# Patient Record
Sex: Female | Born: 1990 | Race: Black or African American | Hispanic: No | Marital: Single | Smoking: Current every day smoker
Health system: Southern US, Community
[De-identification: ages and names within clinical notes are randomized; demographics above are authoritative.]

## PROBLEM LIST (undated history)

## (undated) ENCOUNTER — Inpatient Hospital Stay (HOSPITAL_COMMUNITY): Payer: Self-pay

## (undated) DIAGNOSIS — M419 Scoliosis, unspecified: Secondary | ICD-10-CM

## (undated) DIAGNOSIS — R51 Headache: Secondary | ICD-10-CM

## (undated) DIAGNOSIS — K219 Gastro-esophageal reflux disease without esophagitis: Secondary | ICD-10-CM

## (undated) DIAGNOSIS — M549 Dorsalgia, unspecified: Secondary | ICD-10-CM

## (undated) DIAGNOSIS — D649 Anemia, unspecified: Secondary | ICD-10-CM

## (undated) DIAGNOSIS — IMO0001 Reserved for inherently not codable concepts without codable children: Secondary | ICD-10-CM

## (undated) DIAGNOSIS — A749 Chlamydial infection, unspecified: Secondary | ICD-10-CM

## (undated) DIAGNOSIS — N390 Urinary tract infection, site not specified: Secondary | ICD-10-CM

## (undated) DIAGNOSIS — G8929 Other chronic pain: Secondary | ICD-10-CM

## (undated) HISTORY — PX: BACK SURGERY: SHX140

---
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
## 2009-01-02 ENCOUNTER — Emergency Department (HOSPITAL_COMMUNITY): Admission: EM | Admit: 2009-01-02 | Discharge: 2009-01-02 | Payer: Self-pay | Admitting: Emergency Medicine

## 2009-01-19 ENCOUNTER — Emergency Department (HOSPITAL_COMMUNITY): Admission: EM | Admit: 2009-01-19 | Discharge: 2009-01-19 | Payer: Self-pay | Admitting: Emergency Medicine

## 2009-04-23 ENCOUNTER — Emergency Department (HOSPITAL_COMMUNITY): Admission: EM | Admit: 2009-04-23 | Discharge: 2009-04-23 | Payer: Self-pay | Admitting: Emergency Medicine

## 2009-05-30 ENCOUNTER — Emergency Department (HOSPITAL_COMMUNITY): Admission: EM | Admit: 2009-05-30 | Discharge: 2009-05-30 | Payer: Self-pay | Admitting: Emergency Medicine

## 2009-06-20 ENCOUNTER — Emergency Department (HOSPITAL_COMMUNITY): Admission: EM | Admit: 2009-06-20 | Discharge: 2009-06-20 | Payer: Self-pay | Admitting: Emergency Medicine

## 2009-11-23 ENCOUNTER — Emergency Department (HOSPITAL_COMMUNITY)
Admission: EM | Admit: 2009-11-23 | Discharge: 2009-11-23 | Payer: Self-pay | Source: Home / Self Care | Admitting: Emergency Medicine

## 2010-01-25 ENCOUNTER — Inpatient Hospital Stay (HOSPITAL_COMMUNITY): Admission: AD | Admit: 2010-01-25 | Discharge: 2010-01-25 | Payer: Self-pay | Admitting: Obstetrics & Gynecology

## 2010-01-25 ENCOUNTER — Ambulatory Visit: Payer: Self-pay | Admitting: Obstetrics & Gynecology

## 2010-04-23 ENCOUNTER — Inpatient Hospital Stay (HOSPITAL_COMMUNITY)
Admission: AD | Admit: 2010-04-23 | Discharge: 2010-04-23 | Payer: Self-pay | Source: Home / Self Care | Attending: Obstetrics | Admitting: Obstetrics

## 2010-07-03 LAB — URINALYSIS, ROUTINE W REFLEX MICROSCOPIC
Bilirubin Urine: NEGATIVE
Glucose, UA: NEGATIVE mg/dL
Hgb urine dipstick: NEGATIVE
Ketones, ur: NEGATIVE mg/dL
Nitrite: NEGATIVE
Protein, ur: NEGATIVE mg/dL
Specific Gravity, Urine: >1.03 — ABNORMAL HIGH (ref 1.005–1.030)
Urobilinogen, UA: 0.2 mg/dL (ref 0.0–1.0)
pH: 6 (ref 5.0–8.0)

## 2010-07-03 LAB — CBC
HCT: 30.2 % — ABNORMAL LOW (ref 36.0–46.0)
Hemoglobin: 10.2 g/dL — ABNORMAL LOW (ref 12.0–15.0)
MCHC: 33.8 g/dL (ref 30.0–36.0)
MCV: 86.3 fL (ref 78.0–100.0)
Platelets: 160 10*3/uL (ref 150–400)

## 2010-07-07 LAB — URINALYSIS, ROUTINE W REFLEX MICROSCOPIC
Bilirubin Urine: NEGATIVE
Glucose, UA: NEGATIVE mg/dL
Hgb urine dipstick: NEGATIVE
Ketones, ur: NEGATIVE mg/dL
Nitrite: NEGATIVE
Protein, ur: NEGATIVE mg/dL
Specific Gravity, Urine: 1.026 (ref 1.005–1.030)
Urobilinogen, UA: 1 mg/dL (ref 0.0–1.0)
pH: 7.5 (ref 5.0–8.0)

## 2010-07-07 LAB — BASIC METABOLIC PANEL
BUN: 10 mg/dL (ref 6–23)
CO2: 24 mEq/L (ref 19–32)
Calcium: 9.6 mg/dL (ref 8.4–10.5)
Chloride: 106 mEq/L (ref 96–112)
Creatinine, Ser: 0.67 mg/dL (ref 0.4–1.2)
GFR calc Af Amer: >60 mL/min (ref >60)
GFR calc non Af Amer: >60 mL/min (ref >60)
Glucose, Bld: 83 mg/dL (ref 70–99)
Potassium: 3.8 mEq/L (ref 3.5–5.1)
Sodium: 137 mEq/L (ref 135–145)

## 2010-07-07 LAB — URINE MICROSCOPIC-ADD ON

## 2010-07-07 LAB — CBC
HCT: 39 % (ref 36.0–46.0)
Hemoglobin: 13.2 g/dL (ref 12.0–15.0)
MCH: 29.6 pg (ref 26.0–34.0)
MCHC: 33.8 g/dL (ref 30.0–36.0)
MCV: 87.4 fL (ref 78.0–100.0)
Platelets: 212 10*3/uL (ref 150–400)
RBC: 4.46 MIL/uL (ref 3.87–5.11)
RDW: 12.9 % (ref 11.5–15.5)
WBC: 6.8 10*3/uL (ref 4.0–10.5)

## 2010-07-07 LAB — DIFFERENTIAL
Basophils Absolute: 0 10*3/uL (ref 0.0–0.1)
Basophils Relative: 0 % (ref 0–1)
Eosinophils Absolute: 0.4 10*3/uL (ref 0.0–0.7)
Eosinophils Relative: 5 % (ref 0–5)
Lymphocytes Relative: 47 % — ABNORMAL HIGH (ref 12–46)
Lymphs Abs: 3.2 10*3/uL (ref 0.7–4.0)
Monocytes Absolute: 0.7 10*3/uL (ref 0.1–1.0)
Monocytes Relative: 10 % (ref 3–12)
Neutro Abs: 2.5 10*3/uL (ref 1.7–7.7)
Neutrophils Relative %: 37 % — ABNORMAL LOW (ref 43–77)

## 2010-07-07 LAB — HCG, QUANTITATIVE, PREGNANCY: hCG, Beta Chain, Quant, S: 6154 m[IU]/mL — ABNORMAL HIGH (ref <5)

## 2010-07-07 LAB — POCT PREGNANCY, URINE: Preg Test, Ur: POSITIVE

## 2010-07-09 LAB — POCT PREGNANCY, URINE: Preg Test, Ur: NEGATIVE

## 2010-07-12 LAB — URINALYSIS, ROUTINE W REFLEX MICROSCOPIC
Bilirubin Urine: NEGATIVE
Glucose, UA: NEGATIVE mg/dL
Glucose, UA: NEGATIVE mg/dL
Hgb urine dipstick: NEGATIVE
Hgb urine dipstick: NEGATIVE
Ketones, ur: NEGATIVE mg/dL
Nitrite: POSITIVE — AB
Protein, ur: NEGATIVE mg/dL
Protein, ur: NEGATIVE mg/dL
Specific Gravity, Urine: 1.024 (ref 1.005–1.030)
Urobilinogen, UA: 1 mg/dL (ref 0.0–1.0)
pH: 7 (ref 5.0–8.0)

## 2010-07-12 LAB — URINE MICROSCOPIC-ADD ON

## 2010-07-14 ENCOUNTER — Encounter (HOSPITAL_COMMUNITY)
Admission: RE | Admit: 2010-07-14 | Discharge: 2010-07-14 | Disposition: A | Discharge status: Home / Self Care | Payer: Medicaid Other | Source: Ambulatory Visit | Attending: Obstetrics | Admitting: Obstetrics

## 2010-07-14 LAB — CBC
HCT: 36 % (ref 36.0–46.0)
MCV: 87.6 fL (ref 78.0–100.0)
RBC: 4.11 MIL/uL (ref 3.87–5.11)
RDW: 13.9 % (ref 11.5–15.5)
WBC: 4.9 10*3/uL (ref 4.0–10.5)

## 2010-07-14 LAB — RPR: RPR Ser Ql: NONREACTIVE

## 2010-07-18 ENCOUNTER — Inpatient Hospital Stay (HOSPITAL_COMMUNITY)
Admission: AD | Admit: 2010-07-18 | Discharge: 2010-07-21 | DRG: 775 | Disposition: A | Discharge status: Home / Self Care | Payer: Medicaid Other | Source: Ambulatory Visit | Attending: Obstetrics | Admitting: Obstetrics

## 2010-07-18 DIAGNOSIS — O34219 Maternal care for unspecified type scar from previous cesarean delivery: Principal | ICD-10-CM | POA: Diagnosis present

## 2010-07-18 LAB — CBC
HCT: 34.3 % — ABNORMAL LOW (ref 36.0–46.0)
Hemoglobin: 11.4 g/dL — ABNORMAL LOW (ref 12.0–15.0)
RBC: 3.92 MIL/uL (ref 3.87–5.11)
WBC: 7.2 10*3/uL (ref 4.0–10.5)

## 2010-07-20 ENCOUNTER — Ambulatory Visit (HOSPITAL_COMMUNITY): Admission: RE | Admit: 2010-07-20 | Payer: Medicaid Other | Source: Ambulatory Visit | Admitting: Obstetrics

## 2010-07-20 LAB — CBC
HCT: 25.7 % — ABNORMAL LOW (ref 36.0–46.0)
Hemoglobin: 8.5 g/dL — ABNORMAL LOW (ref 12.0–15.0)
MCV: 87.1 fL (ref 78.0–100.0)
RBC: 2.95 MIL/uL — ABNORMAL LOW (ref 3.87–5.11)
WBC: 13.6 10*3/uL — ABNORMAL HIGH (ref 4.0–10.5)

## 2010-07-29 LAB — GC/CHLAMYDIA PROBE AMP, GENITAL

## 2010-07-29 LAB — URINE MICROSCOPIC-ADD ON

## 2010-07-29 LAB — URINE CULTURE

## 2010-07-29 LAB — POCT PREGNANCY, URINE: Preg Test, Ur: NEGATIVE

## 2010-07-29 LAB — URINALYSIS, ROUTINE W REFLEX MICROSCOPIC
Bilirubin Urine: NEGATIVE
Specific Gravity, Urine: 1.017 (ref 1.005–1.030)
pH: 6 (ref 5.0–8.0)

## 2010-07-29 LAB — WET PREP, GENITAL: Trich, Wet Prep: NONE SEEN

## 2010-08-07 LAB — URINALYSIS, ROUTINE W REFLEX MICROSCOPIC
Bilirubin Urine: NEGATIVE
Hgb urine dipstick: NEGATIVE
Ketones, ur: NEGATIVE mg/dL
Nitrite: NEGATIVE
pH: >9 — ABNORMAL HIGH (ref 5.0–8.0)

## 2010-08-07 LAB — WET PREP, GENITAL
Trich, Wet Prep: NONE SEEN
Yeast Wet Prep HPF POC: NONE SEEN

## 2010-08-07 LAB — CBC
Hemoglobin: 13.6 g/dL (ref 12.0–15.0)
MCHC: 33.2 g/dL (ref 30.0–36.0)
RBC: 4.65 MIL/uL (ref 3.87–5.11)
WBC: 7.4 10*3/uL (ref 4.0–10.5)

## 2010-08-07 LAB — GC/CHLAMYDIA PROBE AMP, GENITAL
Chlamydia, DNA Probe: POSITIVE — AB
GC Probe Amp, Genital: POSITIVE — AB

## 2010-08-07 LAB — URINE MICROSCOPIC-ADD ON
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
## 2010-09-08 NOTE — Op Note (Signed)
NAME:  SHAKEILA, PFARR                ACCOUNT NO.:  [id number]   MEDICAL RECORD NO.:  [id number]          PATIENT TYPE:  INP   LOCATION:  9106                          FACILITY:  [facility]   PHYSICIAN:  Naima A. Dillard, M.D. DATE OF BIRTH:  [birth year]   DATE OF PROCEDURE:  10/17/2005  DATE OF DISCHARGE:                                 OPERATIVE REPORT   PREOPERATIVE DIAGNOSIS:  nonReassuring fetal heart tones, pregnancy-induced  hypertension at term.   POSTOPERATIVE DIAGNOSIS:  nonReassuring fetal heart tones, pregnancy-induced  hypertension at term.   PROCEDURE:  Primary low transverse cesarean section.   ANESTHESIA:  Epidural.   SURGEON:  Naima A. Dillard, M.D.   ASSISTANT:  __________.   ESTIMATED BLOOD LOSS:  300 cc.   URINE OUTPUT:  650 cc clear urine.   IV FLUIDS:  1000 cc Crystalloid.   FINDINGS:  Female infant in vertex presentation, born at 33 with Apgars of 9  and 9.  Weight 6 pounds, 14 ounces.  Baby had nuchal cord x2.  Placenta was  sent to pathology.   PROCEDURE IN DETAIL:  Patient was taken to the operating room after consent  was obtained.  She understood the risks to be but not limited to bleeding,  infection, damage to internal organs, like bowel, bladder, major blood  vessels, and also problems with anesthesia.  The patient decided to proceed  with C-section.  She was taken to the operating room where her epidural  anesthesia was found to be adequate.  A Pfannenstiel incision was made with  the scalp and carried down to the fascia.  The fascia was incised in the  midline and extended bilaterally with Bovie cautery.  Kochers x2 were  placed, and the superior aspect of the fascia was dissected off the rectus  muscles both sharply and bluntly.  The inferior aspect of the fascia was  dissected off the rectus muscles in a similar fashion.  The rectus muscles  were separated in the midline.  The peritoneum was identified and entered  bluntly.  A bladder blade was  placed.  The vesicouterine peritoneum was  identified, tented up and entered sharply and extended bilaterally.  The  bladder blade was replaced.  A primary low transverse uterine incision was  made with a scalpel and extended horizontally and bilaterally with bandage  scissors.  The infant's head was delivered without difficulty.  Nuchal cord  x2 was easily reduced.  Mouth and naris were bulb-suctioned.  The infant was  delivered without difficulty.  The cord was clamped and cut.  The placenta  was delivered.  The uterus was cleared of all clot and debris.  The uterine  incision was repaired with 0 Vicryl in a running locked fashion.  A second  layer of 0 Vicryl was used to embrocate the uterus.  There were several  bleeding areas along the incision line, which were hemostatic with several  figure-of-eight stitches of 0 Vicryl.  Irrigation was done.  Hemostasis was  assured.  The patient was noted to have normal tubes and ovaries  bilaterally.  The peritoneum was  closed with 0 chromic.  The fascia was  closed with 0 Vicryl in a  running fashion.  The skin and subcutaneous tissue was irrigated and made  hemostatic with Bovie cautery.  The skin was closed with 3-0 Monocryl in a  subcuticular fashion.  Sponge, lap, and needle counts were correct.  The  patient went to the recovery room in stable condition.      Naima A. Normand Sloop, M.D.  Electronically Signed     NAD/MEDQ  D:  10/17/2005  T:  10/17/2005  Job:  [id number]
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
## 2010-11-14 ENCOUNTER — Emergency Department (HOSPITAL_COMMUNITY)
Admission: EM | Admit: 2010-11-14 | Discharge: 2010-11-14 | Disposition: A | Discharge status: Home / Self Care | Payer: Medicaid Other | Attending: Emergency Medicine | Admitting: Emergency Medicine

## 2010-11-14 DIAGNOSIS — R51 Headache: Secondary | ICD-10-CM | POA: Insufficient documentation

## 2010-11-14 DIAGNOSIS — M26609 Unspecified temporomandibular joint disorder, unspecified side: Secondary | ICD-10-CM | POA: Insufficient documentation

## 2010-11-18 ENCOUNTER — Emergency Department (HOSPITAL_COMMUNITY)
Admission: EM | Admit: 2010-11-18 | Discharge: 2010-11-18 | Disposition: A | Discharge status: Home / Self Care | Payer: Medicaid Other | Attending: Emergency Medicine | Admitting: Emergency Medicine

## 2010-11-18 ENCOUNTER — Emergency Department (HOSPITAL_COMMUNITY): Payer: Medicaid Other

## 2010-11-18 DIAGNOSIS — R259 Unspecified abnormal involuntary movements: Secondary | ICD-10-CM | POA: Insufficient documentation

## 2010-11-18 DIAGNOSIS — S02600A Fracture of unspecified part of body of mandible, initial encounter for closed fracture: Secondary | ICD-10-CM | POA: Insufficient documentation

## 2010-11-18 DIAGNOSIS — X58XXXA Exposure to other specified factors, initial encounter: Secondary | ICD-10-CM | POA: Insufficient documentation

## 2010-11-18 DIAGNOSIS — R6884 Jaw pain: Secondary | ICD-10-CM | POA: Insufficient documentation

## 2010-11-18 DIAGNOSIS — S02640A Fracture of ramus of mandible, unspecified side, initial encounter for closed fracture: Secondary | ICD-10-CM | POA: Insufficient documentation

## 2010-11-18 LAB — COMPREHENSIVE METABOLIC PANEL
AST: 17 U/L (ref 0–37)
Albumin: 4 g/dL (ref 3.5–5.2)
Alkaline Phosphatase: 53 U/L (ref 39–117)
BUN: 13 mg/dL (ref 6–23)
Chloride: 108 mEq/L (ref 96–112)
Potassium: 4.1 mEq/L (ref 3.5–5.1)
Total Bilirubin: 0.3 mg/dL (ref 0.3–1.2)
Total Protein: 7.2 g/dL (ref 6.0–8.3)

## 2010-11-18 LAB — CBC
MCHC: 33.2 g/dL (ref 30.0–36.0)
Platelets: 218 10*3/uL (ref 150–400)
RDW: 15 % (ref 11.5–15.5)
WBC: 7.2 10*3/uL (ref 4.0–10.5)

## 2010-11-18 LAB — DIFFERENTIAL
Basophils Absolute: 0 10*3/uL (ref 0.0–0.1)
Basophils Relative: 0 % (ref 0–1)
Eosinophils Absolute: 0.4 10*3/uL (ref 0.0–0.7)
Eosinophils Relative: 5 % (ref 0–5)
Monocytes Absolute: 0.4 10*3/uL (ref 0.1–1.0)

## 2010-12-06 ENCOUNTER — Emergency Department (HOSPITAL_COMMUNITY)
Admission: EM | Admit: 2010-12-06 | Discharge: 2010-12-06 | Disposition: A | Discharge status: Home / Self Care | Payer: Medicaid Other | Attending: Emergency Medicine | Admitting: Emergency Medicine

## 2010-12-06 ENCOUNTER — Emergency Department (HOSPITAL_COMMUNITY): Payer: Medicaid Other

## 2010-12-06 DIAGNOSIS — R220 Localized swelling, mass and lump, head: Secondary | ICD-10-CM | POA: Insufficient documentation

## 2010-12-06 DIAGNOSIS — R51 Headache: Secondary | ICD-10-CM | POA: Insufficient documentation

## 2010-12-06 DIAGNOSIS — S02640A Fracture of ramus of mandible, unspecified side, initial encounter for closed fracture: Secondary | ICD-10-CM | POA: Insufficient documentation

## 2010-12-06 DIAGNOSIS — IMO0002 Reserved for concepts with insufficient information to code with codable children: Secondary | ICD-10-CM | POA: Insufficient documentation

## 2011-01-25 ENCOUNTER — Emergency Department (HOSPITAL_COMMUNITY): Payer: Medicaid Other

## 2011-01-25 ENCOUNTER — Emergency Department (HOSPITAL_COMMUNITY)
Admission: EM | Admit: 2011-01-25 | Discharge: 2011-01-25 | Disposition: A | Discharge status: Home / Self Care | Payer: Medicaid Other | Attending: Emergency Medicine | Admitting: Emergency Medicine

## 2011-01-25 DIAGNOSIS — R6884 Jaw pain: Secondary | ICD-10-CM | POA: Insufficient documentation

## 2011-02-10 ENCOUNTER — Emergency Department (HOSPITAL_COMMUNITY)
Admission: EM | Admit: 2011-02-10 | Discharge: 2011-02-10 | Disposition: A | Discharge status: Home / Self Care | Payer: Medicaid Other | Attending: Emergency Medicine | Admitting: Emergency Medicine

## 2011-02-10 DIAGNOSIS — M546 Pain in thoracic spine: Secondary | ICD-10-CM | POA: Insufficient documentation

## 2011-02-10 DIAGNOSIS — M545 Low back pain, unspecified: Secondary | ICD-10-CM | POA: Insufficient documentation

## 2011-02-10 DIAGNOSIS — M5380 Other specified dorsopathies, site unspecified: Secondary | ICD-10-CM | POA: Insufficient documentation

## 2011-05-02 ENCOUNTER — Emergency Department (HOSPITAL_COMMUNITY)
Admission: EM | Admit: 2011-05-02 | Discharge: 2011-05-02 | Disposition: A | Discharge status: Home / Self Care | Payer: Medicaid Other | Attending: Emergency Medicine | Admitting: Emergency Medicine

## 2011-05-02 ENCOUNTER — Encounter (HOSPITAL_COMMUNITY): Payer: Self-pay

## 2011-05-02 ENCOUNTER — Emergency Department (HOSPITAL_COMMUNITY): Payer: Medicaid Other

## 2011-05-02 DIAGNOSIS — F17200 Nicotine dependence, unspecified, uncomplicated: Secondary | ICD-10-CM | POA: Insufficient documentation

## 2011-05-02 DIAGNOSIS — M549 Dorsalgia, unspecified: Secondary | ICD-10-CM

## 2011-05-02 DIAGNOSIS — M545 Low back pain, unspecified: Secondary | ICD-10-CM | POA: Insufficient documentation

## 2011-05-02 MED ORDER — OXYCODONE-ACETAMINOPHEN 5-325 MG PO TABS
1.0000 | ORAL_TABLET | Freq: Once | ORAL | Status: AC
  Administered 2011-05-02: 1 via ORAL
  Filled 2011-05-02: qty 1

## 2011-05-02 MED ORDER — CYCLOBENZAPRINE HCL 10 MG PO TABS: 10.0000 mg | ORAL_TABLET | Freq: Two times a day (BID) | ORAL | Status: AC | PRN

## 2011-05-02 MED ORDER — TRAMADOL HCL 50 MG PO TABS: 50.0000 mg | ORAL_TABLET | Freq: Four times a day (QID) | ORAL | Status: AC | PRN

## 2011-05-02 NOTE — ED Notes (Signed)
States has hx of surgery w/ rods when she was 11 and has scoliosis has been doing a lot of ouside activity w/ her boys like biking and now it hurts to walk

## 2011-05-02 NOTE — ED Provider Notes (Signed)
History     CSN: [id number]  Arrival date & time 05/02/11  1145   First MD Initiated Contact with Patient 05/02/11 1246      Chief Complaint  Patient presents with  . Back Pain    (Consider location/radiation/quality/duration/timing/severity/associated sxs/prior treatment) HPI Patient presents to the emergency room with complaints of back pain that started on Sunday. Patient states she was just playing with her kids. She denies any recent falls or any particular injuries. The pain is in her lower back and to both sides. She states it hurts to walk and increases the pain. She denies any numbness or weakness. No abdominal pain she denies any dysuria. Patient does have history of scoliosis and having prior surgeries for that when she was a child History reviewed. No pertinent past medical history.  History reviewed. No pertinent past surgical history.  No family history on file.  History  Substance Use Topics  . Smoking status: Current Everyday Smoker  . Smokeless tobacco: Not on file  . Alcohol Use: No    OB History    Grav Para Term Preterm Abortions TAB SAB Ect Mult Living                  Review of Systems  All other systems reviewed and are negative.    Allergies  Naproxen  Home Medications   Current Outpatient Rx  Name Route Sig Dispense Refill  . ACETAMINOPHEN 500 MG PO TABS Oral Take 500 mg by mouth every 6 (six) hours as needed. For pain      BP 125/80  Pulse 107  Temp(Src) 98.2 F (36.8 C) (Oral)  Resp 20  Ht 5\' 2"  (1.575 m)  Wt 152 lb (68.947 kg)  BMI 27.80 kg/m2  SpO2 99%  Physical Exam  Nursing note and vitals reviewed. Constitutional: She appears well-developed and well-nourished.  HENT:  Head: Normocephalic and atraumatic.  Right Ear: External ear normal.  Left Ear: External ear normal.  Nose: Nose normal.  Eyes: Conjunctivae and EOM are normal.  Neck: Neck supple. No tracheal deviation present.  Pulmonary/Chest: Effort normal. No  stridor. No respiratory distress.  Abdominal: Soft. Normal appearance. There is no tenderness. No hernia.  Musculoskeletal: She exhibits no edema and no tenderness.       Lumbar back: She exhibits decreased range of motion, tenderness, pain and spasm. She exhibits no swelling and no edema.  Neurological: She is alert. She is not disoriented. No cranial nerve deficit or sensory deficit. She exhibits normal muscle tone. Coordination normal.  Skin: Skin is warm and dry. No rash noted. She is not diaphoretic. No erythema.  Psychiatric: She has a normal mood and affect. Her behavior is normal. Thought content normal.    ED Course  Procedures (including critical care time)  Labs Reviewed - No data to display Dg Lumbar Spine Complete  05/02/2011  *RADIOLOGY REPORT*  Clinical Data: 21 year old female with back pain.  History of scoliosis surgery.  LUMBAR SPINE - COMPLETE 4+ VIEW  Comparison: 05/30/2009 and earlier.  Findings: Normal lumbar segmentation.  Posterior spinal rods re- identified extending caudally from the thoracic spine with laminar hooks terminating at T12-L1.  Levoconvex lumbar scoliosis is stable.  Relatively preserved disc spaces.  Stable vertebral height and alignment.  No pars fracture.  Mild bilateral L5-S1 facet hypertrophy is stable.  Sacrum and SI joints are within normal limits.  IMPRESSION: Stable radiographic appearance of the lumbar spine, no acute osseous abnormality.  Original Report Authenticated By: H.LEE  HALL III, M.D.     1. Back pain       MDM  Patient presents with lumbar back pain. X-rays do not show any abnormalities associated with her surgery. Symptoms suggest a lumbar strain. There is no evidence to suggest acute infection not having abdominal pain there does not appear to be any evidence of acute neurovascular emergency. Patient will be given a prescription for Ultram and Flexeril. I encouraged her to follow up with primary care Dr. if the symptoms are not  improving        Celene Kras, MD 05/02/11 [phone]

## 2011-05-02 NOTE — ED Notes (Signed)
Lower back pain, denies any injuries, pain began on Sunday, denies any numbness or tingling

## 2011-07-04 ENCOUNTER — Encounter (HOSPITAL_COMMUNITY): Payer: Self-pay | Admitting: Emergency Medicine

## 2011-07-04 ENCOUNTER — Emergency Department (HOSPITAL_COMMUNITY)
Admission: EM | Admit: 2011-07-04 | Discharge: 2011-07-04 | Disposition: A | Discharge status: Home / Self Care | Payer: Medicaid Other | Attending: Emergency Medicine | Admitting: Emergency Medicine

## 2011-07-04 DIAGNOSIS — M545 Low back pain, unspecified: Secondary | ICD-10-CM | POA: Insufficient documentation

## 2011-07-04 DIAGNOSIS — Z79899 Other long term (current) drug therapy: Secondary | ICD-10-CM | POA: Insufficient documentation

## 2011-07-04 DIAGNOSIS — F17200 Nicotine dependence, unspecified, uncomplicated: Secondary | ICD-10-CM | POA: Insufficient documentation

## 2011-07-04 DIAGNOSIS — G8929 Other chronic pain: Secondary | ICD-10-CM | POA: Insufficient documentation

## 2011-07-04 DIAGNOSIS — Z9889 Other specified postprocedural states: Secondary | ICD-10-CM | POA: Insufficient documentation

## 2011-07-04 HISTORY — DX: Scoliosis, unspecified: M41.9

## 2011-07-04 MED ORDER — TRAMADOL HCL 50 MG PO TABS: 50.0000 mg | ORAL_TABLET | Freq: Four times a day (QID) | ORAL | Status: AC | PRN

## 2011-07-04 MED ORDER — HYDROCODONE-ACETAMINOPHEN 5-325 MG PO TABS
1.0000 | ORAL_TABLET | Freq: Once | ORAL | Status: AC
  Administered 2011-07-04: 1 via ORAL
  Filled 2011-07-04: qty 1

## 2011-07-04 MED ORDER — CYCLOBENZAPRINE HCL 10 MG PO TABS: 10.0000 mg | ORAL_TABLET | Freq: Two times a day (BID) | ORAL | Status: AC | PRN

## 2011-07-04 NOTE — Discharge Instructions (Signed)
Take all ULTRAM and Flexeril as directed breast followup with orthopedics in the next few days: Make an appointment for your recurrent back pain. Return for new or worse symptoms.

## 2011-07-04 NOTE — ED Notes (Signed)
Onset 2 days ago middle and lower back pain 9/10 burning pain.  States was exercising and developed this pain. Patient has history of scoliosis with surgery when she was 21 years old.

## 2011-07-04 NOTE — ED Provider Notes (Signed)
History     CSN: [id number]  Arrival date & time 07/04/11  1450   First MD Initiated Contact with Patient 07/04/11 1712      Chief Complaint  Patient presents with  . Back Pain    (Consider location/radiation/quality/duration/timing/severity/associated sxs/prior treatment) Patient is a 21 y.o. female presenting with back pain. The history is provided by the patient.  Back Pain  This is a new problem. The current episode started 2 days ago. The problem occurs constantly. The problem has not changed since onset.The pain is associated with no known injury. The pain is present in the lumbar spine. The quality of the pain is described as shooting. The pain does not radiate. The pain is at a severity of 10/10. The pain is moderate. The symptoms are aggravated by bending, twisting and certain positions. Pertinent negatives include no chest pain, no fever, no numbness, no headaches, no abdominal pain, no bowel incontinence, no perianal numbness, no bladder incontinence, no dysuria, no leg pain, no paresthesias, no paresis, no tingling and no weakness.    Past Medical History  Diagnosis Date  . Scoliosis     Past Surgical History  Procedure Date  . Back surgery     No family history on file.  History  Substance Use Topics  . Smoking status: Current Everyday Smoker  . Smokeless tobacco: Not on file  . Alcohol Use: No    OB History    Grav Para Term Preterm Abortions TAB SAB Ect Mult Living                  Review of Systems  Constitutional: Negative for fever.  HENT: Negative for neck pain.   Eyes: Negative for visual disturbance.  Respiratory: Negative for cough and shortness of breath.   Cardiovascular: Negative for chest pain.  Gastrointestinal: Negative for nausea, vomiting, abdominal pain, diarrhea and bowel incontinence.  Genitourinary: Negative for bladder incontinence and dysuria.  Musculoskeletal: Positive for back pain.  Skin: Negative for rash.  Neurological:  Negative for tingling, weakness, numbness, headaches and paresthesias.  Hematological: Does not bruise/bleed easily.    Allergies  Motrin and Naproxen  Home Medications   Current Outpatient Rx  Name Route Sig Dispense Refill  . ACETAMINOPHEN 500 MG PO TABS Oral Take 500 mg by mouth every 6 (six) hours as needed. For pain    . CYCLOBENZAPRINE HCL 10 MG PO TABS Oral Take 1 tablet (10 mg total) by mouth 2 (two) times daily as needed for muscle spasms. 20 tablet 0  . TRAMADOL HCL 50 MG PO TABS Oral Take 1 tablet (50 mg total) by mouth every 6 (six) hours as needed for pain. 15 tablet 0    BP 92/67  Pulse 83  Temp(Src) 97.2 F (36.2 C) (Oral)  Resp 18  SpO2 99%  Physical Exam  Nursing note and vitals reviewed. Constitutional: She is oriented to person, place, and time. She appears well-developed and well-nourished. No distress.  HENT:  Head: Normocephalic and atraumatic.  Mouth/Throat: Oropharynx is clear and moist.  Eyes: Conjunctivae are normal. Pupils are equal, round, and reactive to light.  Neck: Normal range of motion. Neck supple.  Cardiovascular: Normal rate, regular rhythm, normal heart sounds and intact distal pulses.   No murmur heard. Pulmonary/Chest: Effort normal. No respiratory distress.  Abdominal: Soft. Bowel sounds are normal. There is no tenderness.  Musculoskeletal: Normal range of motion. She exhibits no edema and no tenderness.  Lymphadenopathy:    She has no cervical  adenopathy.  Neurological: She is alert and oriented to person, place, and time. No cranial nerve deficit. She exhibits normal muscle tone. Coordination normal.  Skin: Skin is warm. No rash noted.    ED Course  Procedures (including critical care time)  Labs Reviewed - No data to display No results found.   1. Back pain, chronic       MDM  Patient with history of scoliosis history of recurrent back pain problems most recently he was in January has not seen orthopedics recently. No  injury pain is lumbar area no radiation no neurovascular deficits at this time. Patient is allergic to nonsteroidals usually takes Ultram and Flexeril when it acts up.         Shelda Jakes, MD 07/04/11 [phone]

## 2011-07-14 ENCOUNTER — Encounter (HOSPITAL_COMMUNITY): Payer: Self-pay

## 2011-07-14 ENCOUNTER — Emergency Department (HOSPITAL_COMMUNITY)
Admission: EM | Admit: 2011-07-14 | Discharge: 2011-07-14 | Disposition: A | Discharge status: Home / Self Care | Payer: Medicaid Other | Source: Home / Self Care | Attending: Emergency Medicine | Admitting: Emergency Medicine

## 2011-07-14 DIAGNOSIS — K047 Periapical abscess without sinus: Secondary | ICD-10-CM

## 2011-07-14 MED ORDER — HYDROCODONE-ACETAMINOPHEN 5-325 MG PO TABS
ORAL_TABLET | ORAL | Status: AC
  Filled 2011-07-14: qty 2

## 2011-07-14 MED ORDER — HYDROCODONE-ACETAMINOPHEN 5-325 MG PO TABS
2.0000 | ORAL_TABLET | Freq: Once | ORAL | Status: AC
  Administered 2011-07-14: 2 via ORAL

## 2011-07-14 MED ORDER — HYDROCODONE-ACETAMINOPHEN 5-325 MG PO TABS: 1.0000 | ORAL_TABLET | ORAL | Status: AC | PRN

## 2011-07-14 MED ORDER — PENICILLIN V POTASSIUM 500 MG PO TABS: 500.0000 mg | ORAL_TABLET | Freq: Four times a day (QID) | ORAL | Status: AC

## 2011-07-14 NOTE — ED Notes (Signed)
Patient states that her molar on left bottom side has been hurting since Wednesday. Patient states that the tooth was broken off but never bothered her. Patient contacted her dentist and got an antibiotic fir her tooth. Some swelling has decreased but patient states that she is in extreme pain and causing headaches. Patient rates pain on scale 10 on scale (0-10).

## 2011-07-14 NOTE — ED Provider Notes (Signed)
History     CSN: [id number]  Arrival date & time 07/14/11  1219   First MD Initiated Contact with Patient 07/14/11 1222      Chief Complaint  Patient presents with  . Dental Pain    (Consider location/radiation/quality/duration/timing/severity/associated sxs/prior treatment) HPI Comments: Patient presents urgent care complaining of left bottom side tooth ache and throbbing since Wednesday did notice Thursday her face was swollen on the left jaw she did call her dentist they could arrange a visit in prescribe her an antibiotic. Patient describes in the past she had this tooth broken but did never bother her cause her any pain. She started taking antibiotic yesterday has taken a total to those and have noticed some significant swelling improvement. He continues to have throbbing pain in his expressing a headache as well. Patient has not taking anything for pain as he is has a history of allergies to naproxen and Motrin.  Patient is a 21 y.o. female presenting with tooth pain. The history is provided by the patient and a friend.  Dental PainThe primary symptoms include mouth pain, dental injury and headaches. Primary symptoms do not include fever, shortness of breath, sore throat or cough. The symptoms began 3 to 5 days ago. The symptoms are worsening. The symptoms are new.  Additional symptoms include: dental sensitivity to temperature, gum swelling, gum tenderness, jaw pain, facial swelling and ear pain. Additional symptoms do not include: trouble swallowing, pain with swallowing, smell disturbance, drooling, hearing loss, nosebleeds and swollen glands.    Past Medical History  Diagnosis Date  . Scoliosis     Past Surgical History  Procedure Date  . Back surgery     History reviewed. No pertinent family history.  History  Substance Use Topics  . Smoking status: Current Everyday Smoker -- 0.5 packs/day    Types: Cigarettes  . Smokeless tobacco: Not on file  . Alcohol Use: Yes    occasional/social    OB History    Grav Para Term Preterm Abortions TAB SAB Ect Mult Living                  Review of Systems  Constitutional: Negative for fever and activity change.  HENT: Positive for ear pain, facial swelling and dental problem. Negative for hearing loss, nosebleeds, sore throat, drooling and trouble swallowing.   Respiratory: Negative for cough and shortness of breath.   Neurological: Positive for headaches.    Allergies  Motrin and Naproxen  Home Medications   Current Outpatient Rx  Name Route Sig Dispense Refill  . ACETAMINOPHEN 500 MG PO TABS Oral Take 500 mg by mouth every 6 (six) hours as needed. For pain    . CYCLOBENZAPRINE HCL 10 MG PO TABS Oral Take 1 tablet (10 mg total) by mouth 2 (two) times daily as needed for muscle spasms. 20 tablet 0  . HYDROCODONE-ACETAMINOPHEN 5-325 MG PO TABS Oral Take 1 tablet by mouth every 4 (four) hours as needed for pain. 12 tablet 0  . PENICILLIN V POTASSIUM 500 MG PO TABS Oral Take 1 tablet (500 mg total) by mouth 4 (four) times daily. 28 tablet 0  . TRAMADOL HCL 50 MG PO TABS Oral Take 1 tablet (50 mg total) by mouth every 6 (six) hours as needed for pain. 15 tablet 0    BP 124/80  Pulse 84  Temp(Src) 98.6 F (37 C) (Oral)  Resp 16  SpO2 97%  Physical Exam  Constitutional: She appears well-nourished. She appears distressed.  HENT:  Head: Normocephalic.  Mouth/Throat: Oropharynx is clear and moist and mucous membranes are normal. Dental abscesses and dental caries present.    Neck: Neck supple. No JVD present.  Lymphadenopathy:    She has no cervical adenopathy.  Neurological: She is alert. A cranial nerve deficit is present.  Skin: Skin is warm. No rash noted. No erythema.    ED Course  Procedures (including critical care time)  Labs Reviewed - No data to display No results found.   1. Abscess, dental       MDM  Left lower molar early abscess formation patient is afebrile symptomatic  with throbbing pain was taking antibiotics as prescribed by her dentist suboptimal dose 2 doses in 24 hours. Per patient was not prescribed any pain medicine. Patient herself describes that the swelling had gone down since yesterday she continues to have throbbing pain and a headache and left-sided otalgia        Jimmie Molly, MD 07/14/11 1433

## 2011-07-14 NOTE — Discharge Instructions (Signed)
Followup with your dentist Monday as discussed if worsening swelling, fevers worsening pain he should go to the emergency department as discussed. We also discuss to take penicillin every 6 hours as prescribed    Dental Abscess A dental abscess usually starts from an infected tooth. Antibiotic medicine and pain pills can be helpful, but dental infections require the attention of a dentist. Rinse around the infected area often with salt water (a pinch of salt in 8 oz of warm water). Do not apply heat to the outside of your face. See your dentist or oral surgeon as soon as possible.  SEEK IMMEDIATE MEDICAL CARE IF:  You have increasing, severe pain that is not relieved by medicine.   You or your child has an oral temperature above 102 F (38.9 C), not controlled by medicine.   Your baby is older than 3 months with a rectal temperature of 102 F (38.9 C) or higher.   Your baby is 48 months old or younger with a rectal temperature of 100.4 F (38 C) or higher.   You develop chills, severe headache, difficulty breathing, or trouble swallowing.   You have swelling in the neck or around the eye.  Document Released: 04/09/2005 Document Revised: 03/29/2011 Document Reviewed: 09/18/2006 [facility] Patient Information 2012 [place], [place].Dental Abscess A dental abscess usually starts from an infected tooth. Antibiotic medicine and pain pills can be helpful, but dental infections require the attention of a dentist. Rinse around the infected area often with salt water (a pinch of salt in 8 oz of warm water). Do not apply heat to the outside of your face. See your dentist or oral surgeon as soon as possible.  SEEK IMMEDIATE MEDICAL CARE IF:  You have increasing, severe pain that is not relieved by medicine.   You or your child has an oral temperature above 102 F (38.9 C), not controlled by medicine.   Your baby is older than 3 months with a rectal temperature of 102 F (38.9 C) or higher.   Your  baby is 8 months old or younger with a rectal temperature of 100.4 F (38 C) or higher.   You develop chills, severe headache, difficulty breathing, or trouble swallowing.   You have swelling in the neck or around the eye.  Document Released: 04/09/2005 Document Revised: 03/29/2011 Document Reviewed: 09/18/2006 [facility] Patient Information 2012 [place], [place].

## 2011-10-03 ENCOUNTER — Emergency Department (HOSPITAL_COMMUNITY)
Admission: EM | Admit: 2011-10-03 | Discharge: 2011-10-03 | Disposition: A | Discharge status: Home / Self Care | Payer: Self-pay | Attending: Emergency Medicine | Admitting: Emergency Medicine

## 2011-10-03 ENCOUNTER — Encounter (HOSPITAL_COMMUNITY): Payer: Self-pay | Admitting: Emergency Medicine

## 2011-10-03 DIAGNOSIS — K029 Dental caries, unspecified: Secondary | ICD-10-CM | POA: Insufficient documentation

## 2011-10-03 DIAGNOSIS — F17200 Nicotine dependence, unspecified, uncomplicated: Secondary | ICD-10-CM | POA: Insufficient documentation

## 2011-10-03 DIAGNOSIS — M4120 Other idiopathic scoliosis, site unspecified: Secondary | ICD-10-CM | POA: Insufficient documentation

## 2011-10-03 MED ORDER — OXYCODONE-ACETAMINOPHEN 5-325 MG PO TABS
2.0000 | ORAL_TABLET | Freq: Once | ORAL | Status: AC
  Administered 2011-10-03: 2 via ORAL
  Filled 2011-10-03: qty 2

## 2011-10-03 MED ORDER — OXYCODONE-ACETAMINOPHEN 5-325 MG PO TABS: 2.0000 | ORAL_TABLET | ORAL | Status: AC | PRN

## 2011-10-03 MED ORDER — ONDANSETRON 4 MG PO TBDP
4.0000 mg | ORAL_TABLET | Freq: Once | ORAL | Status: AC
  Administered 2011-10-03: 4 mg via ORAL
  Filled 2011-10-03: qty 1

## 2011-10-03 MED ORDER — PENICILLIN V POTASSIUM 500 MG PO TABS: 500.0000 mg | ORAL_TABLET | Freq: Four times a day (QID) | ORAL | Status: AC

## 2011-10-03 NOTE — ED Notes (Signed)
Pt. Stated, I 've had a toothache for 2 weeks

## 2011-10-03 NOTE — Discharge Instructions (Signed)
Dental Caries  Call the dentist today for an appointment.  Return to the ED if you develop new or worsening symptoms. Tooth decay (dental caries, cavities) is the most common of all oral diseases. It occurs in all ages but is more common in children and young adults.  CAUSES  Bacteria in your mouth combine with foods (particularly sugars and starches) to produce plaque. Plaque is a substance that sticks to the hard surfaces of teeth. The bacteria in the plaque produce acids that attack the enamel of teeth. Repeated acid attacks dissolve the enamel and create holes in the teeth. Root surfaces of teeth may also get these holes.  Other contributing factors include:   Frequent snacking and drinking of cavity-producing foods and liquids.   Poor oral hygiene.   Dry mouth.   Substance abuse such as methamphetamine.   Broken or poor fitting dental restorations.   Eating disorders.   Gastroesophageal reflux disease (GERD).   Certain radiation treatments to the head and neck.  SYMPTOMS  At first, dental decay appears as white, chalky areas on the enamel. In this early stage, symptoms are seldom present. As the decay progresses, pits and holes may appear on the enamel surfaces. Progression of the decay will lead to softening of the hard layers of the tooth. At this point you may experience some pain or achy feeling after sweet, hot, or cold foods or drinks are consumed. If left untreated, the decay will reach the internal structures of the tooth and produce severe pain. Extensive dental treatment, such as root canal therapy, may be needed to save the tooth at this late stage of decay development.  DIAGNOSIS  Most cavities will be detected during regular check-ups. A thorough medical and dental history will be taken by the dentist. The dentist will use instruments to check the surfaces of your teeth for any breakdown or discoloration. Some dentists have special instruments, such as lasers, that detect  tooth decay. Dental X-rays may also show some cavities that are not visible to the eye (such as between the contact areas of the teeth). TREATMENT  Treatment involves removal of the tooth decay and replacement with a restorative material such as silver, gold, or composite (white) material. However, if the decay involves a large area of the tooth and there is little remaining healthy tooth structure, a cap (crown) will be fitted over the remaining structure. If the decay involves the center part of the tooth (pulp), root canal treatment will be needed before any type of dental restoration is placed. If the tooth is severely destroyed by the decay process, leaving the remaining tooth structures unrestorable, the tooth will need to be pulled (extracted). Some early tooth decay may be reversed by fluoride treatments and thorough brushing and flossing at home. PREVENTION   Eat healthy foods. Restrict the amount of sugary, starchy foods and liquids you consume. Avoid frequent snacking and drinking of unhealthy foods and liquids.   Sealants can help with prevention of cavities. Sealants are composite resins applied onto the biting surfaces of teeth at risk for decay. They smooth out the pits and grooves and prevent food from being trapped in them. This is done in early childhood before tooth decay has started.   Fluoride tablets may also be prescribed to children between 6 months and 11 years of age if your drinking water is not fluoridated. The fluoride absorbed by the tooth enamel makes teeth less susceptible to decay. Thorough daily cleaning with a toothbrush and dental  floss is the best way to prevent cavities. Use of a fluoride toothpaste is highly recommended. Fluoride mouth rinses may be used in specific cases.   Topical application of fluoride by your dentist is important in children.   Regular visits with a dentist for checkups and cleanings are also important.  SEEK IMMEDIATE DENTAL CARE IF:  You  have a fever.   You develop redness and swelling of your face, jaw, or neck.   You develop swelling around a tooth.   You are unable to open your mouth or cannot swallow.   You have severe pain uncontrolled by pain medicine.  Document Released: 12/30/2001 Document Revised: 03/29/2011 Document Reviewed: 09/14/2010 [facility] Patient Information 2012 [place], [place].

## 2011-10-03 NOTE — ED Provider Notes (Signed)
History   This chart was scribed for Glynn Octave, MD by Charolett Bumpers . The patient was seen in room STRE4/STRE4.    CSN: [id number]  Arrival date & time 10/03/11  4098   First MD Initiated Contact with Patient 10/03/11 1059      Chief Complaint  Patient presents with  . Dental Problem    (Consider location/radiation/quality/duration/timing/severity/associated sxs/prior treatment) HPI Debra Jensen is a 21 y.o. female who presents to the Emergency Department complaining of constant, moderate dental pain for the past 3 days with associated fever and vomiting. Patient states that she has had the dental pain intermitently for 2 weeks, and constant for 3 days. Patient states that the affected tooth is her bottom, left back tooth. Patient states that she has vomited X2 since onset. Patient states that she took ibuprofen 800 for her symptoms with no relief.   Past Medical History  Diagnosis Date  . Scoliosis     Past Surgical History  Procedure Date  . Back surgery     No family history on file.  History  Substance Use Topics  . Smoking status: Current Everyday Smoker -- 0.5 packs/day    Types: Cigarettes  . Smokeless tobacco: Not on file  . Alcohol Use: Yes     occasional/social    OB History    Grav Para Term Preterm Abortions TAB SAB Ect Mult Living                  Review of Systems  Constitutional: Positive for fever. Negative for chills.  HENT: Positive for dental problem.   Respiratory: Negative for shortness of breath.   Gastrointestinal: Positive for vomiting. Negative for nausea.  Neurological: Negative for weakness.  All other systems reviewed and are negative.    Allergies  Motrin and Naproxen  Home Medications   Current Outpatient Rx  Name Route Sig Dispense Refill  . IBUPROFEN 800 MG PO TABS Oral Take 800 mg by mouth every 8 (eight) hours as needed. For pain      BP 123/82  Pulse 78  Temp 97.9 F (36.6 C) (Oral)  Resp 18   SpO2 100%  LMP 09/17/2011  Physical Exam  Nursing note and vitals reviewed. Constitutional: She is oriented to person, place, and time. She appears well-developed and well-nourished. No distress.  HENT:  Head: Normocephalic and atraumatic. No trismus in the jaw.  Mouth/Throat: Uvula is midline. No dental abscesses.       No absess. Left upper incisor is decayed. Left bottom 3rd molar tender to palpation. Non viable. Floor of mouth is soft. No elevation of tongue.    Eyes: EOM are normal.  Neck: Neck supple. No tracheal deviation present.  Cardiovascular: Normal rate.   Pulmonary/Chest: Effort normal. No respiratory distress.  Musculoskeletal: Normal range of motion.  Neurological: She is alert and oriented to person, place, and time.  Skin: Skin is warm and dry.  Psychiatric: She has a normal mood and affect. Her behavior is normal.    ED Course  Procedures (including critical care time)  DIAGNOSTIC STUDIES: Oxygen Saturation is 100% on room air, normal by my interpretation.    COORDINATION OF CARE:   1103: Discussed planned course of treatment with the patient, who is agreeable at this time. Discussed f/u with dentist on call.    Labs Reviewed - No data to display No results found.   No diagnosis found.    MDM  Dental pain without abscess.  Multiple missing  and decayed teeth.  No tongue elevation or trismus.  No evidence of ludwig's angina.  Abx, pain control, f/u dentistry  I personally performed the services described in this documentation, which was scribed in my presence.  The recorded information has been reviewed and considered.        Glynn Octave, MD 10/03/11 1120

## 2011-10-10 ENCOUNTER — Encounter (HOSPITAL_COMMUNITY): Payer: Self-pay | Admitting: Emergency Medicine

## 2011-10-10 ENCOUNTER — Emergency Department (HOSPITAL_COMMUNITY)
Admission: EM | Admit: 2011-10-10 | Discharge: 2011-10-10 | Disposition: A | Discharge status: Home / Self Care | Payer: Self-pay | Attending: Emergency Medicine | Admitting: Emergency Medicine

## 2011-10-10 DIAGNOSIS — F17200 Nicotine dependence, unspecified, uncomplicated: Secondary | ICD-10-CM | POA: Insufficient documentation

## 2011-10-10 DIAGNOSIS — S058X9A Other injuries of unspecified eye and orbit, initial encounter: Secondary | ICD-10-CM | POA: Insufficient documentation

## 2011-10-10 DIAGNOSIS — M4120 Other idiopathic scoliosis, site unspecified: Secondary | ICD-10-CM | POA: Insufficient documentation

## 2011-10-10 DIAGNOSIS — S0500XA Injury of conjunctiva and corneal abrasion without foreign body, unspecified eye, initial encounter: Secondary | ICD-10-CM

## 2011-10-10 DIAGNOSIS — X58XXXA Exposure to other specified factors, initial encounter: Secondary | ICD-10-CM | POA: Insufficient documentation

## 2011-10-10 DIAGNOSIS — G43909 Migraine, unspecified, not intractable, without status migrainosus: Secondary | ICD-10-CM | POA: Insufficient documentation

## 2011-10-10 MED ORDER — TETRACAINE HCL 0.5 % OP SOLN
2.0000 [drp] | Freq: Once | OPHTHALMIC | Status: AC
  Administered 2011-10-10: 2 [drp] via OPHTHALMIC
  Filled 2011-10-10: qty 2

## 2011-10-10 MED ORDER — METOCLOPRAMIDE HCL 5 MG/ML IJ SOLN: 10.0000 mg | Freq: Once | INTRAMUSCULAR | Status: DC

## 2011-10-10 MED ORDER — DIPHENHYDRAMINE HCL 50 MG/ML IJ SOLN: 25.0000 mg | Freq: Once | INTRAMUSCULAR | Status: DC

## 2011-10-10 MED ORDER — OXYCODONE-ACETAMINOPHEN 5-325 MG PO TABS
2.0000 | ORAL_TABLET | Freq: Once | ORAL | Status: AC
  Administered 2011-10-10: 2 via ORAL
  Filled 2011-10-10: qty 2

## 2011-10-10 MED ORDER — FLUORESCEIN SODIUM 1 MG OP STRP
1.0000 | ORAL_STRIP | Freq: Once | OPHTHALMIC | Status: AC
  Administered 2011-10-10: 1 via OPHTHALMIC
  Filled 2011-10-10: qty 1

## 2011-10-10 MED ORDER — OXYCODONE-ACETAMINOPHEN 5-325 MG PO TABS: 1.0000 | ORAL_TABLET | ORAL | Status: AC | PRN

## 2011-10-10 MED ORDER — SODIUM CHLORIDE 0.9 % IV BOLUS (SEPSIS): 1000.0000 mL | Freq: Once | INTRAVENOUS | Status: DC

## 2011-10-10 MED ORDER — ERYTHROMYCIN 5 MG/GM OP OINT
TOPICAL_OINTMENT | Freq: Once | OPHTHALMIC | Status: AC
  Administered 2011-10-10: 21:00:00 via OPHTHALMIC
  Filled 2011-10-10: qty 1

## 2011-10-10 MED ORDER — KETOROLAC TROMETHAMINE 30 MG/ML IJ SOLN: 30.0000 mg | Freq: Once | INTRAMUSCULAR | Status: DC

## 2011-10-10 NOTE — ED Provider Notes (Signed)
History     CSN: [id number]  Arrival date & time 10/10/11  1351   First MD Initiated Contact with Patient 10/10/11 1812      Chief Complaint  Patient presents with  . Migraine  . Eye Pain    (Consider location/radiation/quality/duration/timing/severity/associated sxs/prior treatment) Patient is a 21 y.o. female presenting with migraine and eye pain. The history is provided by the patient and a friend. No language interpreter was used.  Migraine This is a new problem. The current episode started yesterday. Associated symptoms include headaches. Pertinent negatives include no chills, fever, nausea, numbness, vomiting or weakness. The symptoms are aggravated by bending (light). Treatments tried: excedrin migraine. The treatment provided mild relief.  Eye Pain Associated symptoms include headaches. Pertinent negatives include no chills, fever, nausea, numbness, vomiting or weakness.   21 year old female coming in with complaint of her typical migraine headache to her frontal lobe that started yesterday. She's having photo phobia no nausea and vomiting or trauma.  Neuro intact. Patient is also having irritation to his left eye after sleeping in her contacts. States that her vision in the left eye is blurry.  Tetanus up-to-date.  Past Medical History  Diagnosis Date  . Scoliosis     Past Surgical History  Procedure Date  . Back surgery     History reviewed. No pertinent family history.  History  Substance Use Topics  . Smoking status: Current Everyday Smoker -- 0.5 packs/day    Types: Cigarettes  . Smokeless tobacco: Not on file  . Alcohol Use: Yes     occasional/social    OB History    Grav Para Term Preterm Abortions TAB SAB Ect Mult Living                  Review of Systems  Constitutional: Negative for fever and chills.  HENT: Negative for neck stiffness.   Eyes: Positive for pain.  Gastrointestinal: Negative for nausea and vomiting.  Neurological: Positive for  headaches. Negative for dizziness, seizures, facial asymmetry, speech difficulty, weakness, light-headedness and numbness.    Allergies  Motrin and Naproxen  Home Medications   Current Outpatient Rx  Name Route Sig Dispense Refill  . ACETAMINOPHEN 500 MG PO TABS Oral Take 1,000 mg by mouth every 6 (six) hours as needed. For pain    . ASPIRIN-ACETAMINOPHEN-CAFFEINE 250-250-65 MG PO TABS Oral Take 2 tablets by mouth every 6 (six) hours as needed. For migraines    . OXYCODONE-ACETAMINOPHEN 5-325 MG PO TABS Oral Take 2 tablets by mouth every 4 (four) hours as needed for pain. 15 tablet 0  . PENICILLIN V POTASSIUM 500 MG PO TABS Oral Take 1 tablet (500 mg total) by mouth 4 (four) times daily. 40 tablet 0    BP 119/81  Pulse 86  Temp 98.1 F (36.7 C) (Oral)  Resp 18  SpO2 100%  LMP 09/17/2011  Physical Exam  Nursing note and vitals reviewed. Constitutional: She is oriented to person, place, and time. She appears well-developed and well-nourished.  HENT:  Head: Normocephalic.  Eyes: EOM are normal. Pupils are equal, round, and reactive to light. No foreign bodies found. Left eye exhibits discharge. Left eye exhibits no exudate. Left conjunctiva is injected. Left conjunctiva has no hemorrhage.  Neck: Normal range of motion. Neck supple.  Cardiovascular: Normal rate.   Pulmonary/Chest: Effort normal.  Abdominal: Soft.  Musculoskeletal: Normal range of motion.  Neurological: She is alert and oriented to person, place, and time. No cranial nerve deficit. Coordination normal.  Skin: Skin is warm and dry.  Psychiatric: She has a normal mood and affect.    ED Course  Procedures (including critical care time)   Labs Reviewed  POCT PREGNANCY, URINE   No results found.   No diagnosis found.    MDM  Migraine headache and corneal abrasion. Treated in the ER with 2 Percocet and erythromycin ointment to the left eye. Will follow up with pcp this week or return to ER if worse.           Remi Haggard, NP 10/12/11 1621

## 2011-10-10 NOTE — ED Notes (Signed)
Pt attempting to rest; complaining of her head hurting and her left eye also; asking for pain medicine; family at bedside; will inform MD

## 2011-10-10 NOTE — Discharge Instructions (Signed)
Ms Debra Jensen may have detected a corneal abrasion to the L eye.  We will treat that with erythromycin.  You will have blurry vision from the ointment.  We treated your migraine with percocet with relief.   For severe pain he can take the Percocet but do not drive with this medication. Followup with your PCP about your migraine headaches and come up with side planned for next time. The ophthalmologists but is on call as listed below he can followup with him for your left eye pain if not better. Corneal Abrasion The cornea is the clear covering at the front and center of the eye. It is a thin tissue made up of layers. The top layer is the most sensitive layer. A corneal abrasion happens if this layer is scratched or an injury causes it to come off.  HOME CARE  You may be given drops or a medicated cream. Use the medicine as told by your doctor.   A pressure patch may be put over the eye. If this is done, follow your doctor's instructions for when to remove the patch. Do not drive or use machines while the eye patch is on. Judging distances is hard to do with a patch on.   See your doctor for a follow-up exam if you are told to do so.  GET HELP RIGHT AWAY IF:   The pain is getting worse or is very bad.   The eye is very sensitive to light.   Any liquid comes out of the injured eye after treatment.   Your vision suddenly gets worse.   You have a sudden loss of vision or blindness.  MAKE SURE YOU:   Understand these instructions.   Will watch your condition.   Will get help right away if you are not doing well or get worse.  Document Released: 09/26/2007 Document Revised: 03/29/2011 Document Reviewed: 09/26/2007 [facility] Patient Information 2012 [place], [place].

## 2011-10-10 NOTE — ED Notes (Signed)
Pt c/o migraine headache onset yesterday. Pt reports left eye pain. Pt slept in her contacts last night and when she took the contact out she began to have burning pain in left eye.

## 2011-10-13 NOTE — ED Provider Notes (Signed)
Medical screening examination/treatment/procedure(s) were performed by non-physician practitioner and as supervising physician I was immediately available for consultation/collaboration.   Carleene Cooper III, MD 10/13/11 [phone]

## 2011-12-04 ENCOUNTER — Encounter (HOSPITAL_COMMUNITY): Payer: Self-pay | Admitting: *Deleted

## 2011-12-04 ENCOUNTER — Emergency Department (HOSPITAL_COMMUNITY)
Admission: EM | Admit: 2011-12-04 | Discharge: 2011-12-04 | Disposition: A | Discharge status: Home / Self Care | Payer: Self-pay | Attending: Emergency Medicine | Admitting: Emergency Medicine

## 2011-12-04 DIAGNOSIS — M545 Low back pain, unspecified: Secondary | ICD-10-CM | POA: Insufficient documentation

## 2011-12-04 DIAGNOSIS — M549 Dorsalgia, unspecified: Secondary | ICD-10-CM | POA: Insufficient documentation

## 2011-12-04 DIAGNOSIS — Z79899 Other long term (current) drug therapy: Secondary | ICD-10-CM | POA: Insufficient documentation

## 2011-12-04 HISTORY — DX: Dorsalgia, unspecified: M54.9

## 2011-12-04 HISTORY — DX: Other chronic pain: G89.29

## 2011-12-04 MED ORDER — TRAMADOL HCL 50 MG PO TABS: 50.0000 mg | ORAL_TABLET | Freq: Four times a day (QID) | ORAL | Status: AC | PRN

## 2011-12-04 MED ORDER — OXYCODONE-ACETAMINOPHEN 5-325 MG PO TABS
2.0000 | ORAL_TABLET | Freq: Once | ORAL | Status: AC
  Administered 2011-12-04: 2 via ORAL
  Filled 2011-12-04: qty 2

## 2011-12-04 MED ORDER — CYCLOBENZAPRINE HCL 10 MG PO TABS: 10.0000 mg | ORAL_TABLET | Freq: Two times a day (BID) | ORAL | Status: AC | PRN

## 2011-12-04 NOTE — ED Notes (Signed)
Pt is here with lower back pain that Started Sunday when working.  Pt is working on getting into a pain clinic

## 2011-12-04 NOTE — ED Provider Notes (Signed)
History    This chart was scribed for Rolan Bucco, MD, MD by Smitty Pluck. The patient was seen in room TR07C and the patient's care was started at 11:39AM.   CSN: [id number]  Arrival date & time 12/04/11  4098   First MD Initiated Contact with Patient 12/04/11 1110      Chief Complaint  Patient presents with  . Back Pain    (Consider location/radiation/quality/duration/timing/severity/associated sxs/prior treatment) The history is provided by the patient.   Debra Jensen is a 21 y.o. female who presents to the Emergency Department complaining of moderate, chronic lower back pain with symptoms worsening 2 days ago. Pt reports that she has pain that radiates to legs bilaterally, resulting from scoliosis.  No new injuries. Reports the pain is a shooting pain and muscle spasms. Pt reports that she is trying to get an appointment with pain clinic but has not had success. Pt usually takes percocet for pain.  PCP Dr. Bruna Potter   Past Medical History  Diagnosis Date  . Scoliosis   . Back pain, chronic     Past Surgical History  Procedure Date  . Back surgery     No family history on file.  History  Substance Use Topics  . Smoking status: Current Everyday Smoker -- 0.5 packs/day    Types: Cigarettes  . Smokeless tobacco: Not on file  . Alcohol Use: Yes     occasional/social    OB History    Grav Para Term Preterm Abortions TAB SAB Ect Mult Living                  Review of Systems  Constitutional: Negative for fever and chills.  Respiratory: Negative for shortness of breath.   Gastrointestinal: Negative for nausea and vomiting.  Genitourinary: Negative for dysuria.  Musculoskeletal: Positive for back pain.  Neurological: Negative for weakness and numbness.    Allergies  Motrin and Naproxen  Home Medications   Current Outpatient Rx  Name Route Sig Dispense Refill  . ACETAMINOPHEN 500 MG PO TABS Oral Take 1,500 mg by mouth every 6 (six) hours as needed. For pain     . EXCEDRIN PO Oral Take 2 tablets by mouth daily as needed. For pain    . CYCLOBENZAPRINE HCL 10 MG PO TABS Oral Take 1 tablet (10 mg total) by mouth 2 (two) times daily as needed for muscle spasms. 20 tablet 0  . TRAMADOL HCL 50 MG PO TABS Oral Take 1 tablet (50 mg total) by mouth every 6 (six) hours as needed for pain. 15 tablet 0    BP 124/69  Pulse 86  Temp 98.1 F (36.7 C) (Oral)  Resp 20  SpO2 100%  LMP 11/13/2011  Physical Exam  Nursing note and vitals reviewed. Constitutional: She is oriented to person, place, and time. She appears well-developed and well-nourished. No distress.  HENT:  Head: Normocephalic and atraumatic.  Eyes: EOM are normal.  Neck: Neck supple. No tracheal deviation present.  Cardiovascular: Normal rate.   Pulmonary/Chest: Effort normal. No respiratory distress.  Musculoskeletal: Normal range of motion.       Diffuse tenderness across lumbar spine worse on the right. Ng leg raise Motor and sensory intact  Neurological: She is alert and oriented to person, place, and time.  Skin: Skin is warm and dry.  Psychiatric: She has a normal mood and affect. Her behavior is normal.    ED Course  Procedures (including critical care time) DIAGNOSTIC STUDIES: Oxygen Saturation is  100% on room air, normal by my interpretation.    COORDINATION OF CARE: 11:43AM EDP discusses pt ED treatment with pt     Labs Reviewed - No data to display No results found.   1. Back pain       MDM  Pt with exacerbation of chronic LBP.  Will tx with ultram and flexeril, f/u with her PCP.  Does not have any neuro deficits or signs of cauda equina      I personally performed the services described in this documentation, which was scribed in my presence.  The recorded information has been reviewed and considered.      Rolan Bucco, MD 12/04/11 1149

## 2011-12-17 ENCOUNTER — Inpatient Hospital Stay (HOSPITAL_COMMUNITY)
Admission: AD | Admit: 2011-12-17 | Discharge: 2011-12-17 | Disposition: A | Discharge status: Home / Self Care | Payer: Self-pay | Source: Ambulatory Visit | Attending: Obstetrics & Gynecology | Admitting: Obstetrics & Gynecology

## 2011-12-17 ENCOUNTER — Encounter (HOSPITAL_COMMUNITY): Payer: Self-pay | Admitting: *Deleted

## 2011-12-17 DIAGNOSIS — R51 Headache: Secondary | ICD-10-CM | POA: Insufficient documentation

## 2011-12-17 DIAGNOSIS — O210 Mild hyperemesis gravidarum: Secondary | ICD-10-CM | POA: Insufficient documentation

## 2011-12-17 DIAGNOSIS — O219 Vomiting of pregnancy, unspecified: Secondary | ICD-10-CM

## 2011-12-17 DIAGNOSIS — Z331 Pregnant state, incidental: Secondary | ICD-10-CM

## 2011-12-17 DIAGNOSIS — Z3490 Encounter for supervision of normal pregnancy, unspecified, unspecified trimester: Secondary | ICD-10-CM

## 2011-12-17 HISTORY — DX: Urinary tract infection, site not specified: N39.0

## 2011-12-17 HISTORY — DX: Chlamydial infection, unspecified: A74.9

## 2011-12-17 HISTORY — DX: Anemia, unspecified: D64.9

## 2011-12-17 LAB — URINALYSIS, ROUTINE W REFLEX MICROSCOPIC
Glucose, UA: NEGATIVE mg/dL
Hgb urine dipstick: NEGATIVE
Specific Gravity, Urine: 1.025 (ref 1.005–1.030)

## 2011-12-17 LAB — POCT PREGNANCY, URINE: Preg Test, Ur: POSITIVE — AB

## 2011-12-17 MED ORDER — ONDANSETRON 8 MG PO TBDP
8.0000 mg | ORAL_TABLET | Freq: Once | ORAL | Status: AC
  Administered 2011-12-17: 8 mg via ORAL
  Filled 2011-12-17: qty 1

## 2011-12-17 MED ORDER — ONDANSETRON 8 MG PO TBDP: 8.0000 mg | ORAL_TABLET | Freq: Three times a day (TID) | ORAL | Status: AC | PRN

## 2011-12-17 MED ORDER — OXYCODONE-ACETAMINOPHEN 5-325 MG PO TABS
1.0000 | ORAL_TABLET | Freq: Once | ORAL | Status: AC
  Administered 2011-12-17: 1 via ORAL
  Filled 2011-12-17: qty 1

## 2011-12-17 NOTE — MAU Note (Signed)
Found out preg on Friday, home preg test, had missed period.  Has started vomiting.  Ongoing headache.  Needs comfirmation, so can get paperwork for assistance to have abortion.

## 2011-12-17 NOTE — MAU Provider Note (Signed)
History     CSN: [id number]  Arrival date and time: 12/17/11 4098   First Provider Initiated Contact with Patient 12/17/11 1028      Chief Complaint  Patient presents with  . Headache  . Emesis   HPI Debra Jensen is 21 y.o. [id number] [redacted]w[redacted]d presenting for confirmation of pregnancy.  She plans termination needs a letter.   States she has had a headache X 4 days because one of her children is potty training and the other having nosebleeds, she can't sleep and she is scheduled to go back to school on 9/9.  Wants to go home and sleep.  Is asking for crackers.  Nausea.  Denies visual changes. Denies vaginal bleeding.    Past Medical History  Diagnosis Date  . Scoliosis   . Back pain, chronic   . Anemia     with preg  . Urinary tract infection   . Chlamydia     Past Surgical History  Procedure Date  . Cesarean section   . Back surgery     harrington rods    Family History  Problem Relation Age of Onset  . Other Neg Hx     History  Substance Use Topics  . Smoking status: Current Everyday Smoker -- 0.2 packs/day for 4 years    Types: Cigarettes  . Smokeless tobacco: Never Used  . Alcohol Use: No     occasional/social    Allergies:  Allergies  Allergen Reactions  . Motrin (Ibuprofen) Other (See Comments)    Redness and bumps on face  . Naproxen Hives    Prescriptions prior to admission  Medication Sig Dispense Refill  . acetaminophen (TYLENOL) 500 MG tablet Take 1,500 mg by mouth every 6 (six) hours as needed. For pain      . Aspirin-Acetaminophen-Caffeine (EXCEDRIN PO) Take 2 tablets by mouth daily as needed. For pain        Review of Systems  Constitutional: Negative.   Respiratory: Negative.   Cardiovascular: Negative.   Gastrointestinal: Positive for nausea.  Genitourinary: Negative.   Neurological: Positive for headaches. Negative for dizziness, tingling, tremors and focal weakness.   Physical Exam   Blood pressure 120/72, pulse 94, temperature 98.6  F (37 C), temperature source Oral, resp. rate 18, height 5' 2.5" (1.588 m), weight 55.792 kg (123 lb), last menstrual period 11/13/2011.  Physical Exam  Constitutional: She is oriented to person, place, and time. She appears well-developed and well-nourished. No distress.  HENT:  Head: Normocephalic.  Neck: Normal range of motion.  Cardiovascular: Normal rate.   Respiratory: Effort normal.  GI: Soft. She exhibits no distension and no mass. There is no tenderness. There is no rebound and no guarding.  Genitourinary:       Not indicated  Neurological: She is alert and oriented to person, place, and time. No cranial nerve deficit. Coordination normal.  Skin: Skin is warm and dry.  Psychiatric: She has a normal mood and affect. Her behavior is normal.   Results for orders placed during the hospital encounter of 12/17/11 (from the past 24 hour(s))  URINALYSIS, ROUTINE W REFLEX MICROSCOPIC     Status: Abnormal   Collection Time   12/17/11  9:55 AM      Component Value Range   Color, Urine YELLOW  YELLOW   APPearance HAZY (*) CLEAR   Specific Gravity, Urine 1.025  1.005 - 1.030   pH 6.0  5.0 - 8.0   Glucose, UA NEGATIVE  NEGATIVE mg/dL  Hgb urine dipstick NEGATIVE  NEGATIVE   Bilirubin Urine NEGATIVE  NEGATIVE   Ketones, ur NEGATIVE  NEGATIVE mg/dL   Protein, ur NEGATIVE  NEGATIVE mg/dL   Urobilinogen, UA 0.2  0.0 - 1.0 mg/dL   Nitrite NEGATIVE  NEGATIVE   Leukocytes, UA NEGATIVE  NEGATIVE  POCT PREGNANCY, URINE     Status: Abnormal   Collection Time   12/17/11 10:40 AM      Component Value Range   Preg Test, Ur POSITIVE (*) NEGATIVE   MAU Course  Procedures    MDM Percocet 1 tab and Zofran 8mg  ODT given in MAU.   11:40  Went in to check on patient.  She is feeling much better.  Pain and nausea relieved.  Asking for medication for home.  Will given Zofran.    Assessment and Plan  A:  Early pregnancy-here for confirmation letter     Nausea     Headache  P:  Verification  letter based on LMP given to patient     Headache and nausea resolved with medication    Rx for Zofran given.  Greg Cratty,EVE M 12/17/2011, 10:29 AM

## 2012-02-01 ENCOUNTER — Inpatient Hospital Stay (HOSPITAL_COMMUNITY)
Admission: AD | Admit: 2012-02-01 | Discharge: 2012-02-01 | Disposition: A | Discharge status: Home / Self Care | Payer: Medicaid Other | Source: Ambulatory Visit | Attending: Obstetrics | Admitting: Obstetrics

## 2012-02-01 ENCOUNTER — Encounter (HOSPITAL_COMMUNITY): Payer: Self-pay

## 2012-02-01 DIAGNOSIS — O219 Vomiting of pregnancy, unspecified: Secondary | ICD-10-CM

## 2012-02-01 DIAGNOSIS — O210 Mild hyperemesis gravidarum: Secondary | ICD-10-CM | POA: Insufficient documentation

## 2012-02-01 DIAGNOSIS — O26899 Other specified pregnancy related conditions, unspecified trimester: Secondary | ICD-10-CM

## 2012-02-01 DIAGNOSIS — R51 Headache: Secondary | ICD-10-CM | POA: Insufficient documentation

## 2012-02-01 DIAGNOSIS — M549 Dorsalgia, unspecified: Secondary | ICD-10-CM

## 2012-02-01 LAB — URINALYSIS, ROUTINE W REFLEX MICROSCOPIC
Glucose, UA: NEGATIVE mg/dL
Leukocytes, UA: NEGATIVE
pH: 7 (ref 5.0–8.0)

## 2012-02-01 MED ORDER — ACETAMINOPHEN-CODEINE #3 300-30 MG PO TABS: 1.0000 | ORAL_TABLET | ORAL | Status: DC | PRN

## 2012-02-01 MED ORDER — ACETAMINOPHEN-CODEINE #3 300-30 MG PO TABS
1.0000 | ORAL_TABLET | ORAL | Status: DC | PRN
  Administered 2012-02-01: 1 via ORAL
  Filled 2012-02-01: qty 1

## 2012-02-01 MED ORDER — PROMETHAZINE HCL 25 MG PO TABS: 12.5000 mg | ORAL_TABLET | Freq: Four times a day (QID) | ORAL | Status: DC | PRN

## 2012-02-01 MED ORDER — ONDANSETRON 8 MG PO TBDP
8.0000 mg | ORAL_TABLET | ORAL | Status: AC
  Administered 2012-02-01: 8 mg via ORAL
  Filled 2012-02-01: qty 1

## 2012-02-01 NOTE — MAU Note (Signed)
Headache, back pain and vomiting since seen last week.

## 2012-02-01 NOTE — MAU Provider Note (Signed)
Chief Complaint: Headache, Emesis During Pregnancy and Back Pain   First Provider Initiated Contact with Patient 02/01/12 1748     SUBJECTIVE HPI: Debra Jensen is a 21 y.o. [id number] at [redacted]w[redacted]d by LMP who presents to maternity admissions reporting n/v x1 week aggravating her back pain from hx of scoliosis and causing headache.  She has applied for Medicaid but has not received her card yet.  She denies LOF, vaginal bleeding, vaginal itching/burning, urinary symptoms, dizziness, or fever/chills.     Past Medical History  Diagnosis Date  . Scoliosis   . Back pain, chronic   . Anemia     with preg  . Urinary tract infection   . Chlamydia    Past Surgical History  Procedure Date  . Cesarean section   . Back surgery     harrington rods   History   Social History  . Marital Status: Single    Spouse Name: N/A    Number of Children: N/A  . Years of Education: N/A   Occupational History  . Not on file.   Social History Main Topics  . Smoking status: Current Every Day Smoker -- 0.2 packs/day for 4 years    Types: Cigarettes  . Smokeless tobacco: Never Used  . Alcohol Use: No     occasional/social  . Drug Use: No  . Sexually Active: Yes    Birth Control/ Protection: None   Other Topics Concern  . Not on file   Social History Narrative  . No narrative on file   No current facility-administered medications on file prior to encounter.   Current Outpatient Prescriptions on File Prior to Encounter  Medication Sig Dispense Refill  . acetaminophen (TYLENOL) 500 MG tablet Take 1,500 mg by mouth every 6 (six) hours as needed. For pain       Allergies  Allergen Reactions  . Motrin (Ibuprofen) Other (See Comments)    Redness and bumps on face  . Naproxen Hives    ROS: Pertinent items in HPI  OBJECTIVE Blood pressure 131/83, pulse 93, temperature 99.3 F (37.4 C), temperature source Oral, resp. rate 18, last menstrual period 11/13/2011. GENERAL: Well-developed,  well-nourished female in no acute distress.  HEENT: Normocephalic HEART: normal rate RESP: normal effort ABDOMEN: Soft, non-tender EXTREMITIES: Nontender, no edema NEURO: Alert and oriented SPECULUM EXAM: Deferred  LAB RESULTS Results for orders placed during the hospital encounter of 02/01/12 (from the past 24 hour(s))  URINALYSIS, ROUTINE W REFLEX MICROSCOPIC     Status: Abnormal   Collection Time   02/01/12  5:10 PM      Component Value Range   Color, Urine YELLOW  YELLOW   APPearance CLOUDY (*) CLEAR   Specific Gravity, Urine 1.020  1.005 - 1.030   pH 7.0  5.0 - 8.0   Glucose, UA NEGATIVE  NEGATIVE mg/dL   Hgb urine dipstick NEGATIVE  NEGATIVE   Bilirubin Urine NEGATIVE  NEGATIVE   Ketones, ur NEGATIVE  NEGATIVE mg/dL   Protein, ur NEGATIVE  NEGATIVE mg/dL   Urobilinogen, UA 1.0  0.0 - 1.0 mg/dL   Nitrite NEGATIVE  NEGATIVE   Leukocytes, UA NEGATIVE  NEGATIVE   FHT 135 by doppler  ASSESSMENT 1. Nausea/vomiting in pregnancy   2. Back pain in pregnancy   3. Headache in pregnancy     PLAN Zofran ODT 8 mg and Tylenol #3 x1 tab in MAU--Pt had complete relief of symptoms with treatment Discharge home Encourage PO fluids.  Pt does not drink  water.  Discussed alternatives to increase fluid intake without increasing caffeine or sugar. Zofran ODT 4 mg PO Q8 hours Tylenol #3, 1 tab Q 6 hours PRN pain F/U with Dr Gaynell Face at scheduled prenatal appointment Return to MAU as needed   Sharen Counter Certified Nurse-Midwife 02/01/2012  5:49 PM

## 2012-03-08 ENCOUNTER — Inpatient Hospital Stay (HOSPITAL_COMMUNITY)
Admission: AD | Admit: 2012-03-08 | Discharge: 2012-03-08 | Disposition: A | Discharge status: Home / Self Care | Payer: Self-pay | Source: Ambulatory Visit | Attending: Obstetrics & Gynecology | Admitting: Obstetrics & Gynecology

## 2012-03-08 ENCOUNTER — Encounter (HOSPITAL_COMMUNITY): Payer: Self-pay | Admitting: *Deleted

## 2012-03-08 DIAGNOSIS — M545 Low back pain, unspecified: Secondary | ICD-10-CM | POA: Insufficient documentation

## 2012-03-08 DIAGNOSIS — M62830 Muscle spasm of back: Secondary | ICD-10-CM

## 2012-03-08 DIAGNOSIS — O99891 Other specified diseases and conditions complicating pregnancy: Secondary | ICD-10-CM | POA: Insufficient documentation

## 2012-03-08 DIAGNOSIS — M5380 Other specified dorsopathies, site unspecified: Secondary | ICD-10-CM | POA: Insufficient documentation

## 2012-03-08 LAB — URINALYSIS, ROUTINE W REFLEX MICROSCOPIC
Ketones, ur: NEGATIVE mg/dL
Leukocytes, UA: NEGATIVE
Nitrite: NEGATIVE
Specific Gravity, Urine: 1.02 (ref 1.005–1.030)
Urobilinogen, UA: 0.2 mg/dL (ref 0.0–1.0)
pH: 7 (ref 5.0–8.0)

## 2012-03-08 MED ORDER — CYCLOBENZAPRINE HCL 10 MG PO TABS
10.0000 mg | ORAL_TABLET | Freq: Once | ORAL | Status: AC
  Administered 2012-03-08: 10 mg via ORAL
  Filled 2012-03-08: qty 1

## 2012-03-08 MED ORDER — OXYCODONE-ACETAMINOPHEN 5-325 MG PO TABS
2.0000 | ORAL_TABLET | Freq: Once | ORAL | Status: AC
  Administered 2012-03-08: 2 via ORAL
  Filled 2012-03-08: qty 2

## 2012-03-08 MED ORDER — CYCLOBENZAPRINE HCL 10 MG PO TABS: 10.0000 mg | ORAL_TABLET | Freq: Three times a day (TID) | ORAL | Status: DC | PRN

## 2012-03-08 MED ORDER — ACETAMINOPHEN-CODEINE #3 300-30 MG PO TABS: 1.0000 | ORAL_TABLET | ORAL | Status: DC | PRN

## 2012-03-08 NOTE — MAU Note (Signed)
Low back pain started on Wed. Ongoing, woke her during the night.  Has tried Tylenol and heating pads.  Hx of scoliosis and 2 prev pregnancies- never had pain like this before.

## 2012-03-08 NOTE — MAU Provider Note (Signed)
Attestation of Attending Supervision of Advanced Practitioner (CNM/NP): Evaluation and management procedures were performed by the Advanced Practitioner under my supervision and collaboration. I have reviewed the Advanced Practitioner's note and chart, and I agree with the management and plan.  Velmer Broadfoot H. 4:22 PM   

## 2012-03-08 NOTE — MAU Provider Note (Signed)
History     CSN: [id number]  Arrival date and time: 03/08/12 1233   First Provider Initiated Contact with Patient 03/08/12 1259      Chief Complaint  Patient presents with  . Back Pain   HPI 21 y.o. [id number] at [redacted]w[redacted]d with low back pain. H/o back pain d/t scoliois. Worse with working, worse with activity, also worse with laying down for long periods of time. . Tylenol not helping much.  No abd pain, vag bleeding, discharge. Allergic to motrin - gets rash, severe itching. Plans PNC with Dr. Gaynell Face, awaiting medicaid approval.    Past Medical History  Diagnosis Date  . Scoliosis   . Back pain, chronic   . Anemia     with preg  . Urinary tract infection   . Chlamydia     Past Surgical History  Procedure Date  . Cesarean section   . Back surgery     harrington rods    Family History  Problem Relation Age of Onset  . Other Neg Hx     History  Substance Use Topics  . Smoking status: Current Every Day Smoker -- 0.2 packs/day for 4 years    Types: Cigarettes  . Smokeless tobacco: Never Used  . Alcohol Use: No     Comment: occasional/social    Allergies:  Allergies  Allergen Reactions  . Motrin (Ibuprofen) Other (See Comments)    Redness and bumps on face  . Naproxen Hives    Prescriptions prior to admission  Medication Sig Dispense Refill  . acetaminophen-codeine (TYLENOL #3) 300-30 MG per tablet Take 1 tablet by mouth every 4 (four) hours as needed.  30 tablet  0  . promethazine (PHENERGAN) 25 MG tablet Take 0.5 tablets (12.5 mg total) by mouth every 6 (six) hours as needed for nausea.  30 tablet  2    ROS Physical Exam   Last menstrual period 11/13/2011.  Physical Exam  Nursing note and vitals reviewed. Constitutional: She is oriented to person, place, and time. She appears well-developed and well-nourished. No distress.  Cardiovascular: Normal rate.   Respiratory: Effort normal.  GI: Soft. There is no tenderness.  Musculoskeletal: Normal range of  motion. She exhibits tenderness (lumbar, left > right).       Lumbar back: She exhibits tenderness and spasm (esp left side).  Neurological: She is alert and oriented to person, place, and time.  Skin: Skin is warm and dry.  Psychiatric: She has a normal mood and affect.    MAU Course  Procedures  Results for orders placed during the hospital encounter of 03/08/12 (from the past 24 hour(s))  URINALYSIS, ROUTINE W REFLEX MICROSCOPIC     Status: Abnormal   Collection Time   03/08/12 12:48 PM      Component Value Range   Color, Urine YELLOW  YELLOW   APPearance CLOUDY (*) CLEAR   Specific Gravity, Urine 1.020  1.005 - 1.030   pH 7.0  5.0 - 8.0   Glucose, UA NEGATIVE  NEGATIVE mg/dL   Hgb urine dipstick NEGATIVE  NEGATIVE   Bilirubin Urine NEGATIVE  NEGATIVE   Ketones, ur NEGATIVE  NEGATIVE mg/dL   Protein, ur NEGATIVE  NEGATIVE mg/dL   Urobilinogen, UA 0.2  0.0 - 1.0 mg/dL   Nitrite NEGATIVE  NEGATIVE   Leukocytes, UA NEGATIVE  NEGATIVE      . [COMPLETED] cyclobenzaprine  10 mg Oral Once  . [COMPLETED] oxyCODONE-acetaminophen  2 tablet Oral Once   Pain improved with above  meds.   Assessment and Plan   1. Muscle spasm of back       Medication List     As of 03/08/2012  2:54 PM    START taking these medications         acetaminophen-codeine 300-30 MG per tablet   Commonly known as: TYLENOL #3   Take 1 tablet by mouth every 4 (four) hours as needed for pain.      cyclobenzaprine 10 MG tablet   Commonly known as: FLEXERIL   Take 1 tablet (10 mg total) by mouth 3 (three) times daily as needed for muscle spasms.      CONTINUE taking these medications         promethazine 25 MG tablet   Commonly known as: PHENERGAN   Take 0.5 tablets (12.5 mg total) by mouth every 6 (six) hours as needed for nausea.      STOP taking these medications         acetaminophen 500 MG tablet   Commonly known as: TYLENOL          Where to get your medications    These are the  prescriptions that you need to pick up. We sent them to a specific pharmacy, so you will need to go there to get them.   [id number] [place], [place] - [address]    [address] [place] [address]    Phone: [phone]        cyclobenzaprine 10 MG tablet         You may get these medications from any pharmacy.         acetaminophen-codeine 300-30 MG per tablet            Follow-up Information    Follow up with MARSHALL,BERNARD A, MD. (as scheduled)    Contact information:   [address] [place] [address] [phone]            Daishaun Ayre 03/08/2012, 12:59 PM

## 2012-03-23 ENCOUNTER — Inpatient Hospital Stay (HOSPITAL_COMMUNITY)
Admission: AD | Admit: 2012-03-23 | Discharge: 2012-03-23 | Disposition: A | Discharge status: Home / Self Care | Payer: Self-pay | Source: Ambulatory Visit | Attending: Obstetrics & Gynecology | Admitting: Obstetrics & Gynecology

## 2012-03-23 ENCOUNTER — Encounter (HOSPITAL_COMMUNITY): Payer: Self-pay | Admitting: *Deleted

## 2012-03-23 DIAGNOSIS — R0781 Pleurodynia: Secondary | ICD-10-CM

## 2012-03-23 DIAGNOSIS — W010XXA Fall on same level from slipping, tripping and stumbling without subsequent striking against object, initial encounter: Secondary | ICD-10-CM | POA: Insufficient documentation

## 2012-03-23 DIAGNOSIS — S20219A Contusion of unspecified front wall of thorax, initial encounter: Secondary | ICD-10-CM | POA: Insufficient documentation

## 2012-03-23 DIAGNOSIS — M545 Low back pain, unspecified: Secondary | ICD-10-CM | POA: Insufficient documentation

## 2012-03-23 DIAGNOSIS — Y93E1 Activity, personal bathing and showering: Secondary | ICD-10-CM | POA: Insufficient documentation

## 2012-03-23 DIAGNOSIS — O99891 Other specified diseases and conditions complicating pregnancy: Secondary | ICD-10-CM | POA: Insufficient documentation

## 2012-03-23 DIAGNOSIS — S335XXA Sprain of ligaments of lumbar spine, initial encounter: Secondary | ICD-10-CM | POA: Insufficient documentation

## 2012-03-23 DIAGNOSIS — Y92009 Unspecified place in unspecified non-institutional (private) residence as the place of occurrence of the external cause: Secondary | ICD-10-CM | POA: Insufficient documentation

## 2012-03-23 LAB — URINALYSIS, ROUTINE W REFLEX MICROSCOPIC
Bilirubin Urine: NEGATIVE
Nitrite: NEGATIVE
Protein, ur: NEGATIVE mg/dL
Specific Gravity, Urine: 1.015 (ref 1.005–1.030)
Urobilinogen, UA: 0.2 mg/dL (ref 0.0–1.0)

## 2012-03-23 LAB — URINE MICROSCOPIC-ADD ON

## 2012-03-23 MED ORDER — HYDROCODONE-ACETAMINOPHEN 5-325 MG PO TABS
1.0000 | ORAL_TABLET | Freq: Once | ORAL | Status: AC
  Administered 2012-03-23: 1 via ORAL
  Filled 2012-03-23: qty 1

## 2012-03-23 MED ORDER — OXYCODONE-ACETAMINOPHEN 5-325 MG PO TABS: 1.0000 | ORAL_TABLET | ORAL | Status: DC | PRN

## 2012-03-23 MED ORDER — CYCLOBENZAPRINE HCL 10 MG PO TABS
10.0000 mg | ORAL_TABLET | Freq: Once | ORAL | Status: AC
  Administered 2012-03-23: 10 mg via ORAL
  Filled 2012-03-23: qty 1

## 2012-03-23 NOTE — MAU Provider Note (Signed)
History     CSN: [id number]  Arrival date and time: 03/23/12 4098   First Provider Initiated Contact with Patient 03/23/12 1959      Chief Complaint  Patient presents with  . Fall  . Back Pain   HPI Debra Jensen is a 21 y.o. female @ [redacted]w[redacted]d gestation who presents to MAU with back pain. The pain is located in the lower back. The pain started at approximately 4:20 pm after falling in the shower. Patient states she was washing her feet and slipped and started to fall hit left elbow and lower back on left side. She rate the pain as 8/10. She denies hitting head or LOC. She denies vaginal bleeding or leaking of fluid. The history was provided by the patient.  OB History    Grav Para Term Preterm Abortions TAB SAB Ect Mult Living   3 2 2  0 0 0 0 0 0 2      Past Medical History  Diagnosis Date  . Scoliosis   . Back pain, chronic   . Anemia     with preg  . Urinary tract infection   . Chlamydia     Past Surgical History  Procedure Date  . Cesarean section   . Back surgery     harrington rods    Family History  Problem Relation Age of Onset  . Other Neg Hx     History  Substance Use Topics  . Smoking status: Current Every Day Smoker -- 0.2 packs/day for 4 years    Types: Cigarettes  . Smokeless tobacco: Never Used  . Alcohol Use: No     Comment: occasional/social    Allergies:  Allergies  Allergen Reactions  . Motrin (Ibuprofen) Other (See Comments)    Redness and bumps on face  . Naproxen Hives    Prescriptions prior to admission  Medication Sig Dispense Refill  . acetaminophen-codeine (TYLENOL #3) 300-30 MG per tablet Take 1 tablet by mouth every 4 (four) hours as needed for pain.  30 tablet  0  . calcium carbonate (TUMS - DOSED IN MG ELEMENTAL CALCIUM) 500 MG chewable tablet Chew 1 tablet by mouth daily as needed. For heartburn      . cyclobenzaprine (FLEXERIL) 10 MG tablet Take 1 tablet (10 mg total) by mouth 3 (three) times daily as needed for muscle  spasms.  30 tablet  0    Review of Systems  Constitutional: Negative for fever, chills and weight loss.  HENT: Negative for ear pain, nosebleeds, congestion, sore throat and neck pain.   Eyes: Negative for blurred vision, double vision, photophobia and pain.  Respiratory: Negative for cough, shortness of breath and wheezing.   Cardiovascular: Negative for chest pain, palpitations and leg swelling.  Gastrointestinal: Negative for heartburn, nausea, vomiting, abdominal pain, diarrhea and constipation.  Genitourinary: Negative for dysuria, urgency and frequency.  Musculoskeletal: Positive for back pain and falls. Negative for myalgias.  Skin: Negative for itching and rash.  Neurological: Negative for dizziness, sensory change, speech change, seizures, weakness and headaches.  Endo/Heme/Allergies: Does not bruise/bleed easily.  Psychiatric/Behavioral: Negative for depression and substance abuse. The patient is not nervous/anxious and does not have insomnia.    Physical Exam   Blood pressure 111/57, pulse 89, temperature 98.1 F (36.7 C), temperature source Oral, resp. rate 16, height 5\' 2"  (1.575 m), weight 124 lb (56.246 kg), last menstrual period 11/13/2011.  Physical Exam  Nursing note and vitals reviewed. Constitutional: She is oriented to person, place,  and time. She appears well-developed and well-nourished. No distress.  HENT:  Head: Normocephalic and atraumatic.  Eyes: EOM are normal.  Neck: Neck supple.  Cardiovascular: Normal rate and normal heart sounds.   Respiratory: Effort normal and breath sounds normal.       Posterior left lower rib tenderness with palpation and range of motion.  GI: Soft. There is no tenderness.       Positive FHT's.  Musculoskeletal:       Tender on palpation lower lumbar area on the left. Pan with range of motion.  Neurological: She is alert and oriented to person, place, and time. She has normal strength. No cranial nerve deficit or sensory  deficit. Coordination and gait normal.  Skin: Skin is warm and dry.  Psychiatric: She has a normal mood and affect. Her behavior is normal. Judgment and thought content normal.   Assessment: 21 y.o. female @ [redacted]w[redacted]d with left rib pain and left lumbar pain   Contusion left ribs   Lumbar strain  Plan:  Discussed with patient in detail no x-ray tonight and will treat as contusion since normal breath sounds and will avoid radiation @ [redacted] weeks gestation.    Rx Percocet for pain   Patient has flexeril at home.   Instructions on rib contusion and low back strain  Patient concerned due to unable to get prenatal care. Has applied for Medicaid but has not received card. I will have OB office to call patient and schedule a follow up appointment for recheck and prenatal care.  MAU Course  Procedures NEESE,HOPE, RN, FNP, [facility] 03/23/2012, 8:00 PM

## 2012-03-23 NOTE — MAU Note (Addendum)
Patient states she fell in the shower on left side this evening. Has a history back problems

## 2012-03-25 ENCOUNTER — Encounter (HOSPITAL_COMMUNITY): Payer: Self-pay | Admitting: Emergency Medicine

## 2012-03-25 ENCOUNTER — Emergency Department (HOSPITAL_COMMUNITY)
Admission: EM | Admit: 2012-03-25 | Discharge: 2012-03-26 | Disposition: A | Discharge status: Home / Self Care | Payer: Self-pay | Attending: Emergency Medicine | Admitting: Emergency Medicine

## 2012-03-25 DIAGNOSIS — O99334 Smoking (tobacco) complicating childbirth: Secondary | ICD-10-CM | POA: Insufficient documentation

## 2012-03-25 DIAGNOSIS — O99019 Anemia complicating pregnancy, unspecified trimester: Secondary | ICD-10-CM | POA: Insufficient documentation

## 2012-03-25 DIAGNOSIS — R0789 Other chest pain: Secondary | ICD-10-CM | POA: Insufficient documentation

## 2012-03-25 DIAGNOSIS — S299XXA Unspecified injury of thorax, initial encounter: Secondary | ICD-10-CM

## 2012-03-25 DIAGNOSIS — M549 Dorsalgia, unspecified: Secondary | ICD-10-CM | POA: Insufficient documentation

## 2012-03-25 DIAGNOSIS — Y929 Unspecified place or not applicable: Secondary | ICD-10-CM | POA: Insufficient documentation

## 2012-03-25 DIAGNOSIS — Z87440 Personal history of urinary (tract) infections: Secondary | ICD-10-CM | POA: Insufficient documentation

## 2012-03-25 DIAGNOSIS — Z8619 Personal history of other infectious and parasitic diseases: Secondary | ICD-10-CM | POA: Insufficient documentation

## 2012-03-25 DIAGNOSIS — O9989 Other specified diseases and conditions complicating pregnancy, childbirth and the puerperium: Secondary | ICD-10-CM | POA: Insufficient documentation

## 2012-03-25 DIAGNOSIS — Y939 Activity, unspecified: Secondary | ICD-10-CM | POA: Insufficient documentation

## 2012-03-25 DIAGNOSIS — R296 Repeated falls: Secondary | ICD-10-CM | POA: Insufficient documentation

## 2012-03-25 DIAGNOSIS — Z79899 Other long term (current) drug therapy: Secondary | ICD-10-CM | POA: Insufficient documentation

## 2012-03-25 DIAGNOSIS — S298XXA Other specified injuries of thorax, initial encounter: Secondary | ICD-10-CM | POA: Insufficient documentation

## 2012-03-25 LAB — URINE CULTURE: Colony Count: >=100000

## 2012-03-25 NOTE — ED Notes (Signed)
Pt will not take a deep breath for lung field assessment, pt rapid shallow resp. At same time of discussing severe pain, eating graham crackers and watching TV

## 2012-03-25 NOTE — ED Notes (Signed)
Pt states it hurts to take a deep breath, when asked if she supports area of pain, states she is afraid to since it hurts.

## 2012-03-25 NOTE — ED Notes (Signed)
Pt brought in by EMS with c/o rib pain  Pt fell a couple days ago and fractured her ribs on the left side  Pt tonight is c/o pain  Pt states it hurts when she takes a deep breath  Pt is also [redacted] weeks pregnant  No complications with the pregnancy

## 2012-03-26 ENCOUNTER — Other Ambulatory Visit: Payer: Self-pay | Admitting: Advanced Practice Midwife

## 2012-03-26 ENCOUNTER — Other Ambulatory Visit: Payer: Self-pay | Admitting: Obstetrics & Gynecology

## 2012-03-26 DIAGNOSIS — O2342 Unspecified infection of urinary tract in pregnancy, second trimester: Secondary | ICD-10-CM

## 2012-03-26 MED ORDER — NITROFURANTOIN MONOHYD MACRO 100 MG PO CAPS: 100.0000 mg | ORAL_CAPSULE | Freq: Two times a day (BID) | ORAL | Status: DC

## 2012-03-26 MED ORDER — OXYCODONE-ACETAMINOPHEN 5-325 MG PO TABS: 1.0000 | ORAL_TABLET | ORAL | Status: DC | PRN

## 2012-03-26 MED ORDER — ACETAMINOPHEN 325 MG PO TABS
650.0000 mg | ORAL_TABLET | Freq: Once | ORAL | Status: AC
  Administered 2012-03-26: 650 mg via ORAL
  Filled 2012-03-26: qty 2

## 2012-03-26 MED ORDER — OXYCODONE-ACETAMINOPHEN 5-325 MG PO TABS
2.0000 | ORAL_TABLET | Freq: Once | ORAL | Status: AC
  Administered 2012-03-26: 2 via ORAL
  Filled 2012-03-26: qty 2

## 2012-03-26 NOTE — ED Notes (Signed)
Pt upset, wanting something for pain, Tylenol and Ice pack provided, " I could walk to the [facility] store and got that, they have me on Percocet, why are they giving me Tylenol, I have ice in the Fucking freezer" Pt made aware that we are following protocol and the doctor will evaluate as soon as possible."

## 2012-03-26 NOTE — MAU Provider Note (Signed)
Attestation of Attending Supervision of Advanced Practitioner (CNM/NP): Evaluation and management procedures were performed by the Advanced Practitioner under my supervision and collaboration. I have reviewed the Advanced Practitioner's note and chart, and I agree with the management and plan.  Yerlin Gasparyan H. 9:06 PM

## 2012-03-26 NOTE — Progress Notes (Signed)
E. coli UTI incidental finding on reflex urine culture. Rx Macrobid.

## 2012-03-26 NOTE — ED Provider Notes (Signed)
History     CSN: [id number]  Arrival date & time 03/25/12  2014   First MD Initiated Contact with Patient 03/25/12 2336      Chief Complaint  Patient presents with  . Chest Pain    (Consider location/radiation/quality/duration/timing/severity/associated sxs/prior treatment) HPI hx provided by patient. [redacted] weeks pregnant. She fell in the shower 3 days ago injuring her left ribs. She was evaluated at [facility] hospital and prescribed pain medications and told to followup with her OB/GYN, Dr. Gaynell Face in 2 days. She called today and was told she could not be seen in OB/GYN clinic and was told to come to the emergency department. She has ran out of pain medications and has persistent left-sided rib pain, sharp in quality and not radiating. It hurts to take a deep breath. No cough. No fevers. No shortness of breath otherwise. Patient denies any other pain injury or trauma. No complications with her pregnancy. No vaginal bleeding. No loss of fluids. No abdominal pain. No pelvic Pain. Past Medical History  Diagnosis Date  . Scoliosis   . Back pain, chronic   . Anemia     with preg  . Urinary tract infection   . Chlamydia     Past Surgical History  Procedure Date  . Cesarean section   . Back surgery     harrington rods    Family History  Problem Relation Age of Onset  . Other Neg Hx   . Hypertension Other     History  Substance Use Topics  . Smoking status: Current Every Day Smoker -- 0.2 packs/day for 4 years    Types: Cigarettes  . Smokeless tobacco: Never Used  . Alcohol Use: No     Comment: occasional/social    OB History    Grav Para Term Preterm Abortions TAB SAB Ect Mult Living   3 2 2  0 0 0 0 0 0 2      Review of Systems  Constitutional: Negative for fever and chills.  HENT: Negative for neck pain and neck stiffness.   Eyes: Negative for pain.  Respiratory: Negative for cough and shortness of breath.   Cardiovascular: Positive for chest pain.   Gastrointestinal: Negative for abdominal pain.  Genitourinary: Negative for dysuria.  Musculoskeletal: Negative for back pain.  Skin: Negative for rash.  Neurological: Negative for headaches.  All other systems reviewed and are negative.    Allergies  Motrin and Naproxen  Home Medications   Current Outpatient Rx  Name  Route  Sig  Dispense  Refill  . CALCIUM CARBONATE ANTACID 500 MG PO CHEW   Oral   Chew 1 tablet by mouth daily as needed. For heartburn         . CYCLOBENZAPRINE HCL 10 MG PO TABS   Oral   Take 1 tablet (10 mg total) by mouth 3 (three) times daily as needed for muscle spasms.   30 tablet   0   . OXYCODONE-ACETAMINOPHEN 5-325 MG PO TABS   Oral   Take 1 tablet by mouth every 4 (four) hours as needed for pain.   10 tablet   0   . PROMETHAZINE HCL 25 MG PO TABS   Oral   Take 25 mg by mouth every 6 (six) hours as needed. Nausea           BP 114/54  Pulse 86  Temp 98 F (36.7 C) (Oral)  Resp 20  SpO2 100%  LMP 11/13/2011  Physical Exam  Constitutional: She  is oriented to person, place, and time. She appears well-developed and well-nourished.  HENT:  Head: Normocephalic and atraumatic.  Eyes: Conjunctivae normal and EOM are normal. Pupils are equal, round, and reactive to light.  Neck: Trachea normal. Neck supple. No thyromegaly present.  Cardiovascular: Normal rate, regular rhythm, S1 normal, S2 normal and normal pulses.     No systolic murmur is present   No diastolic murmur is present  Pulses:      Radial pulses are 2+ on the right side, and 2+ on the left side.  Pulmonary/Chest: Effort normal and breath sounds normal. She has no wheezes. She has no rhonchi. She has no rales.       TTP L lateral ribs, no crepitus or erythema/ rash  Abdominal: Soft. Normal appearance and bowel sounds are normal. There is no tenderness. There is no CVA tenderness and negative Murphy's sign.       Gravid c/w dates  Musculoskeletal:       BLE:s Calves  nontender, no cords or erythema, negative Homans sign  Neurological: She is alert and oriented to person, place, and time. She has normal strength. No cranial nerve deficit or sensory deficit. GCS eye subscore is 4. GCS verbal subscore is 5. GCS motor subscore is 6.  Skin: Skin is warm and dry. No rash noted. She is not diaphoretic.  Psychiatric: Her speech is normal.       Cooperative and appropriate    ED Course  Procedures (including critical care time)  Room Air pulse ox 100% is adequate  Review of old records from OB/GYN visit 2 days ago , was prescribed Percocet.  pain medications provided. Patient provided with incentive Spirometer MDM   Rib pain after fall. Equal Breath sounds and adequate pulse ox. [redacted] weeks pregnant and holding it chest x-ray. Clinically any significant pneumothorax. No abdominal pain or tenderness to suggest intra-abdominal trauma.   Plan close PCP follow up, DR Gaynell Face OB GYN. Return Precautions verbalized as understood.      Sunnie Nielsen, MD 03/26/12 [phone]

## 2012-03-26 NOTE — ED Notes (Signed)
Pt come to the door stating she was able to use the incentive spir. Also apologized for earlier behavior. Pt tired and frustrated feeling no one really care. Much happier at discharge

## 2012-04-07 ENCOUNTER — Ambulatory Visit (INDEPENDENT_AMBULATORY_CARE_PROVIDER_SITE_OTHER): Payer: Self-pay | Admitting: Obstetrics & Gynecology

## 2012-04-07 ENCOUNTER — Encounter: Payer: Self-pay | Admitting: Obstetrics & Gynecology

## 2012-04-07 VITALS — BP 105/68 | Temp 97.5°F | Wt 129.3 lb

## 2012-04-07 DIAGNOSIS — O0992 Supervision of high risk pregnancy, unspecified, second trimester: Secondary | ICD-10-CM | POA: Insufficient documentation

## 2012-04-07 DIAGNOSIS — M419 Scoliosis, unspecified: Secondary | ICD-10-CM | POA: Insufficient documentation

## 2012-04-07 DIAGNOSIS — M4120 Other idiopathic scoliosis, site unspecified: Secondary | ICD-10-CM

## 2012-04-07 DIAGNOSIS — O34219 Maternal care for unspecified type scar from previous cesarean delivery: Secondary | ICD-10-CM

## 2012-04-07 LAB — POCT URINALYSIS DIP (DEVICE)
Bilirubin Urine: NEGATIVE
Hgb urine dipstick: NEGATIVE
Leukocytes, UA: NEGATIVE
Nitrite: NEGATIVE
Protein, ur: NEGATIVE mg/dL
Urobilinogen, UA: 0.2 mg/dL (ref 0.0–1.0)
pH: 7 (ref 5.0–8.0)

## 2012-04-07 MED ORDER — PRENATAL VITAMINS 0.8 MG PO TABS: 1.0000 | ORAL_TABLET | Freq: Every day | ORAL | Status: DC

## 2012-04-07 NOTE — Patient Instructions (Signed)

## 2012-04-07 NOTE — Progress Notes (Signed)
[phone] 

## 2012-04-07 NOTE — Progress Notes (Signed)
U/S scheduled 04/09/12 at 3 pm. Physical Therapy scheduled on 04/17/12 at 345 pm at the [facility] outpatient rehab center.

## 2012-04-07 NOTE — Progress Notes (Signed)
5  Subjective:    Debra Jensen is a [id number] [redacted]w[redacted]d being seen today for her first obstetrical visit.  Her obstetrical history is significant for back pain with h/o scoliosis. Had a fall 2 week ago and went to MAU. Patient does intend to breast feed. Pregnancy history fully reviewed.  Patient reports backache.  Filed Vitals:   04/07/12 0936  BP: 105/68  Temp: 97.5 F (36.4 C)  Weight: 129 lb 4.8 oz (58.65 kg)    HISTORY: OB History    Grav Para Term Preterm Abortions TAB SAB Ect Mult Living   3 2 2  0 0 0 0 0 0 2     # Outc Date GA Lbr Len/2nd Wgt Sex Del Anes PTL Lv   1 TRM 6/07   6lb14oz(3.118kg) M LTCS  No Yes   Comments: FH slowed after getting epid   2 TRM 3/12   7lb1oz(3.204kg) M VBAC  No Yes   3 CUR              Past Medical History  Diagnosis Date  . Scoliosis   . Back pain, chronic   . Anemia     with preg  . Urinary tract infection   . Chlamydia    Past Surgical History  Procedure Date  . Cesarean section   . Back surgery     harrington rods   Family History  Problem Relation Age of Onset  . Other Neg Hx   . Hypertension Other   . Hypertension Mother   . Cancer Mother   . Cancer Paternal Uncle      Exam    Uterus:     Pelvic Exam:    Perineum: No Hemorrhoids   Vulva: normal   Vagina:  normal mucosa   pH:    Cervix: no lesions   Adnexa: normal adnexa   Bony Pelvis: average  System: Breast:  normal appearance, no masses or tenderness   Skin: normal coloration and turgor, no rashes    Neurologic: oriented, normal   Extremities: normal strength, tone, and muscle mass, large well healed scar on upper back   HEENT PERRLA, neck supple with midline trachea and thyroid without masses   Mouth/Teeth mucous membranes moist, pharynx normal without lesions and broken front teeth   Neck supple   Cardiovascular: regular rate and rhythm   Respiratory:  appears well, vitals normal, no respiratory distress, acyanotic, normal RR, neck free of mass or  lymphadenopathy, chest clear, no wheezing, crepitations, rhonchi, normal symmetric air entry   Abdomen: soft, non-tender; bowel sounds normal; no masses,  no organomegaly and gravid c/w dates   Urinary: urethral meatus normal      Assessment:    Pregnancy: [id number] Patient Active Problem List  Diagnosis  . Supervision of high risk pregnancy in second trimester  . Scoliosis        Plan:     Initial labs drawn. Prenatal vitamins. Problem list reviewed and updated. Genetic Screening too late, [place] scheduled  Ultrasound discussed; fetal survey: requested.  Follow up in 1 weeks. 50% of 30 min visit spent on counseling and coordination of care.  Need FT consult re back pain with scoliosis s/p repair age 21   Charlean Carneal 04/07/2012

## 2012-04-07 NOTE — Progress Notes (Signed)
Nutrition note: 1st visit consult Pt has gained 6.3#, which is < expected. Pt reports eating 4-5x/ & diet appears low in essential nutrients: pt reports eating minimal fruits, vegetables & calcium rich foods and drinks very little water but a lot of soda (pepsi) daily.  Pt is not taking PNV. Pt reports no N&V but has heartburn.  Pt received verbal & written education on general nutrition during pregnancy. Disc importance of PNV & a healthy diet. Encouraged no more than 1-2 cups of caffeinated beverages/ d. Disc importance of BF. Disc wt gain goals of 25-35# or 1#/wk. Pt agrees to start taking PNV & try to decrease Pepsi intake & increase water intake.  Pt does not receive WIC but plans to apply. Pt is not sure about BF but stated she is willing to do at least some. F/u in 4-6 wks Blondell Reveal, MS, RD, LDN

## 2012-04-08 LAB — OBSTETRIC PANEL
Basophils Absolute: 0 10*3/uL (ref 0.0–0.1)
Basophils Relative: 0 % (ref 0–1)
Eosinophils Absolute: 0.4 10*3/uL (ref 0.0–0.7)
Hepatitis B Surface Ag: NEGATIVE
MCH: 29 pg (ref 26.0–34.0)
MCHC: 33.3 g/dL (ref 30.0–36.0)
Neutro Abs: 3.9 10*3/uL (ref 1.7–7.7)
Neutrophils Relative %: 55 % (ref 43–77)
Platelets: 210 10*3/uL (ref 150–400)
RDW: 14.9 % (ref 11.5–15.5)
Rh Type: POSITIVE

## 2012-04-09 ENCOUNTER — Ambulatory Visit (HOSPITAL_COMMUNITY)
Admission: RE | Admit: 2012-04-09 | Discharge: 2012-04-09 | Disposition: A | Discharge status: Home / Self Care | Payer: Self-pay | Source: Ambulatory Visit | Attending: Obstetrics & Gynecology | Admitting: Obstetrics & Gynecology

## 2012-04-09 DIAGNOSIS — O0992 Supervision of high risk pregnancy, unspecified, second trimester: Secondary | ICD-10-CM

## 2012-04-09 DIAGNOSIS — Z363 Encounter for antenatal screening for malformations: Secondary | ICD-10-CM | POA: Insufficient documentation

## 2012-04-09 DIAGNOSIS — O358XX0 Maternal care for other (suspected) fetal abnormality and damage, not applicable or unspecified: Secondary | ICD-10-CM | POA: Insufficient documentation

## 2012-04-09 DIAGNOSIS — Z1389 Encounter for screening for other disorder: Secondary | ICD-10-CM | POA: Insufficient documentation

## 2012-04-10 ENCOUNTER — Encounter: Payer: Self-pay | Admitting: *Deleted

## 2012-04-10 DIAGNOSIS — O34219 Maternal care for unspecified type scar from previous cesarean delivery: Secondary | ICD-10-CM | POA: Insufficient documentation

## 2012-04-17 ENCOUNTER — Ambulatory Visit: Payer: Self-pay | Attending: Obstetrics & Gynecology | Admitting: Physical Therapy

## 2012-04-21 ENCOUNTER — Encounter: Payer: Self-pay | Admitting: Obstetrics and Gynecology

## 2012-04-22 ENCOUNTER — Encounter (HOSPITAL_COMMUNITY): Payer: Self-pay | Admitting: *Deleted

## 2012-04-22 ENCOUNTER — Inpatient Hospital Stay (HOSPITAL_COMMUNITY)
Admission: AD | Admit: 2012-04-22 | Discharge: 2012-04-22 | Disposition: A | Discharge status: Home / Self Care | Payer: Self-pay | Source: Ambulatory Visit | Attending: Obstetrics & Gynecology | Admitting: Obstetrics & Gynecology

## 2012-04-22 DIAGNOSIS — O99891 Other specified diseases and conditions complicating pregnancy: Secondary | ICD-10-CM | POA: Insufficient documentation

## 2012-04-22 DIAGNOSIS — M62830 Muscle spasm of back: Secondary | ICD-10-CM

## 2012-04-22 DIAGNOSIS — M549 Dorsalgia, unspecified: Secondary | ICD-10-CM | POA: Insufficient documentation

## 2012-04-22 DIAGNOSIS — M5380 Other specified dorsopathies, site unspecified: Secondary | ICD-10-CM | POA: Insufficient documentation

## 2012-04-22 HISTORY — DX: Headache: R51

## 2012-04-22 HISTORY — DX: Gastro-esophageal reflux disease without esophagitis: K21.9

## 2012-04-22 HISTORY — DX: Reserved for inherently not codable concepts without codable children: IMO0001

## 2012-04-22 LAB — URINALYSIS, ROUTINE W REFLEX MICROSCOPIC
Glucose, UA: NEGATIVE mg/dL
Hgb urine dipstick: NEGATIVE
Protein, ur: NEGATIVE mg/dL
Specific Gravity, Urine: 1.015 (ref 1.005–1.030)
pH: 7 (ref 5.0–8.0)

## 2012-04-22 LAB — URINE MICROSCOPIC-ADD ON

## 2012-04-22 MED ORDER — CYCLOBENZAPRINE HCL 5 MG PO TABS: 5.0000 mg | ORAL_TABLET | Freq: Three times a day (TID) | ORAL | Status: DC | PRN

## 2012-04-22 MED ORDER — CYCLOBENZAPRINE HCL 10 MG PO TABS
10.0000 mg | ORAL_TABLET | ORAL | Status: AC
  Administered 2012-04-22: 10 mg via ORAL
  Filled 2012-04-22: qty 1

## 2012-04-22 MED ORDER — OXYCODONE-ACETAMINOPHEN 5-325 MG PO TABS
2.0000 | ORAL_TABLET | ORAL | Status: AC
  Administered 2012-04-22: 2 via ORAL
  Filled 2012-04-22: qty 2

## 2012-04-22 MED ORDER — OXYCODONE-ACETAMINOPHEN 5-325 MG PO TABS: 1.0000 | ORAL_TABLET | ORAL | Status: DC | PRN

## 2012-04-22 NOTE — MAU Note (Addendum)
States she has been treated in the past for chronic back pain with pain med and muscle relaxer. (Flexeril, Tylenol #3) States she has to come in about once a month to get more medication. States she does OK for awhile and then gets to the place where she can't get up. States she had to cancel her trip to [place] because she could not tolerate the long trip.

## 2012-04-22 NOTE — MAU Provider Note (Signed)
Chief Complaint:  Back Pain   None     HPI: Debra Jensen is a 21 y.o. G3P2002 at [redacted]w[redacted]d who presents to maternity admissions reporting back spasm from hx of scoliosis.  She is crying and rocking in the bed upon arrival in MAU.  She canceled a recent family trip to [place] because of her pain and inability to sit in the car for several hours.  She reports good fetal movement, denies LOF, vaginal bleeding, vaginal itching/burning, urinary symptoms, h/a, dizziness, n/v, or fever/chills.     Pregnancy Course:   Past Medical History: Past Medical History  Diagnosis Date  . Scoliosis   . Back pain, chronic   . Anemia     with preg  . Urinary tract infection   . Chlamydia   . Reflux   . Headache     Past obstetric history: OB History    Grav Para Term Preterm Abortions TAB SAB Ect Mult Living   3 2 2  0 0 0 0 0 0 2     # Outc Date GA Lbr Len/2nd Wgt Sex Del Anes PTL Lv   1 TRM 6/07   6lb14oz(3.118kg) M LTCS  No Yes   Comments: FH slowed after getting epid   2 TRM 3/12   7lb1oz(3.204kg) M VBAC  No Yes   3 CUR               Past Surgical History: Past Surgical History  Procedure Date  . Cesarean section   . Back surgery     harrington rods    Family History: Family History  Problem Relation Age of Onset  . Other Neg Hx   . Hypertension Other   . Hypertension Mother   . Cancer Mother   . Cancer Paternal Uncle     Social History: History  Substance Use Topics  . Smoking status: Current Every Day Smoker -- 0.2 packs/day for 4 years    Types: Cigarettes  . Smokeless tobacco: Never Used  . Alcohol Use: No     Comment: occasional/social    Allergies:  Allergies  Allergen Reactions  . Naproxen Hives  . Motrin (Ibuprofen) Rash and Other (See Comments)    Redness and bumps on face    Meds:  Prescriptions prior to admission  Medication Sig Dispense Refill  . calcium carbonate (TUMS - DOSED IN MG ELEMENTAL CALCIUM) 500 MG chewable tablet Chew 1 tablet by mouth daily  as needed. For heartburn      . Prenatal Multivit-Min-Fe-FA (PRENATAL VITAMINS) 0.8 MG tablet Take 1 tablet by mouth daily.  30 tablet  12  . promethazine (PHENERGAN) 25 MG tablet Take 25 mg by mouth every 6 (six) hours as needed. Nausea        ROS: Pertinent findings in history of present illness.  Physical Exam  Blood pressure 110/55, pulse 89, temperature 98.2 F (36.8 C), resp. rate 18, height 5\' 2"  (1.575 m), weight 59.875 kg (132 lb), last menstrual period 11/13/2011, SpO2 100.00%, unknown if currently breastfeeding. GENERAL: Well-developed, well-nourished female in mild distress.  HEENT: normocephalic HEART: normal rate RESP: normal effort ABDOMEN: Soft, non-tender, gravid appropriate for gestational age BACK: Negative CVA tenderness, positive tenderness of lumbar spine EXTREMITIES: Nontender, no edema NEURO: alert and oriented SPECULUM EXAM: Deferred    FHT:  Baseline 145, moderate variability, accelerations present (10 x10), isolated variable Contractions: None   Labs: Results for orders placed during the hospital encounter of 04/22/12 (from the past 24 hour(s))  URINALYSIS, ROUTINE W REFLEX MICROSCOPIC     Status: Abnormal   Collection Time   04/22/12  3:50 PM      Component Value Range   Color, Urine YELLOW  YELLOW   APPearance CLOUDY (*) CLEAR   Specific Gravity, Urine 1.015  1.005 - 1.030   pH 7.0  5.0 - 8.0   Glucose, UA NEGATIVE  NEGATIVE mg/dL   Hgb urine dipstick NEGATIVE  NEGATIVE   Bilirubin Urine NEGATIVE  NEGATIVE   Ketones, ur NEGATIVE  NEGATIVE mg/dL   Protein, ur NEGATIVE  NEGATIVE mg/dL   Urobilinogen, UA 0.2  0.0 - 1.0 mg/dL   Nitrite NEGATIVE  NEGATIVE   Leukocytes, UA TRACE (*) NEGATIVE  URINE MICROSCOPIC-ADD ON     Status: Abnormal   Collection Time   04/22/12  3:50 PM      Component Value Range   Squamous Epithelial / LPF MANY (*) RARE   WBC, UA 7-10  <3 WBC/hpf   RBC / HPF 3-6  <3 RBC/hpf   Bacteria, UA MANY (*) RARE   Urine-Other  MUCOUS PRESENT       Assessment: 1. Muscle spasm of back     Plan: Flexeril 10 mg, Percocet 5/325 PO x2 tabs given in MAU Discharge home Flexeril 5 mg TID PRN and Percocet 5/325 Q4 hours x15 tabs prescribed F/U as scheduled in [facility] Return to MAU as needed    Medication List     As of 04/22/2012  5:02 PM    ASK your doctor about these medications         calcium carbonate 500 MG chewable tablet   Commonly known as: TUMS - dosed in mg elemental calcium   Chew 1 tablet by mouth daily as needed. For heartburn      PRENATAL VITAMINS 0.8 MG tablet   Take 1 tablet by mouth daily.      promethazine 25 MG tablet   Commonly known as: PHENERGAN   Take 25 mg by mouth every 6 (six) hours as needed. Nausea        Sharen Counter Certified Nurse-Midwife 04/22/2012 5:02 PM

## 2012-04-22 NOTE — MAU Note (Addendum)
Back pain worse today, left flank pain, always had back pain, worse today , hard to move and walk, intense since yesterday,  No pain with voiding, reports chills, no fever, Temp 98.2 in triage

## 2012-04-22 NOTE — Discharge Instructions (Signed)
Muscle Cramps Muscle cramps are due to sudden involuntary muscle contraction. This means you have no control over the tightening of a muscle (or muscles). Often there are no obvious causes. Muscle cramps may occur with overexertion. They may also occur with chilling of the muscles. An example of a muscle chilling activity is swimming. It is uncommon for cramps to be due to a serious underlying disorder. In most cases, muscle cramps improve (or leave) within minutes. CAUSES  Some common causes are:  Injury.  Infections, especially viral.  Abnormal levels of the salts and ions in your blood (electrolytes). This could happen if you are taking water pills (diuretics).  Blood vessel disease where not enough blood is getting to the muscles (intermittent claudication). Some uncommon causes are:  Side effects of some medicine (such as lithium).  Alcohol abuse.  Diseases where there is soreness (inflammation) of the muscular system. HOME CARE INSTRUCTIONS   It may be helpful to massage, stretch, and relax the affected muscle.  Taking a dose of over-the-counter diphenhydramine is helpful for night leg cramps. SEEK MEDICAL CARE IF:  Cramps are frequent and not relieved with medicine. MAKE SURE YOU:   Understand these instructions.  Will watch your condition.  Will get help right away if you are not doing well or get worse. Document Released: 09/29/2001 Document Revised: 07/02/2011 Document Reviewed: 03/31/2008 [facility] Patient Information 2013 [place], [place]. Muscle Cramps Muscle cramps are due to sudden involuntary muscle contraction. This means you have no control over the tightening of a muscle (or muscles). Often there are no obvious causes. Muscle cramps may occur with overexertion. They may also occur with chilling of the muscles. An example of a muscle chilling activity is swimming. It is uncommon for cramps to be due to a serious underlying disorder. In most cases, muscle cramps  improve (or leave) within minutes. CAUSES  Some common causes are:  Injury.  Infections, especially viral.  Abnormal levels of the salts and ions in your blood (electrolytes). This could happen if you are taking water pills (diuretics).  Blood vessel disease where not enough blood is getting to the muscles (intermittent claudication). Some uncommon causes are:  Side effects of some medicine (such as lithium).  Alcohol abuse.  Diseases where there is soreness (inflammation) of the muscular system. HOME CARE INSTRUCTIONS   It may be helpful to massage, stretch, and relax the affected muscle.  Taking a dose of over-the-counter diphenhydramine is helpful for night leg cramps. SEEK MEDICAL CARE IF:  Cramps are frequent and not relieved with medicine. MAKE SURE YOU:   Understand these instructions.  Will watch your condition.  Will get help right away if you are not doing well or get worse. Document Released: 09/29/2001 Document Revised: 07/02/2011 Document Reviewed: 03/31/2008 [facility] Patient Information 2013 [place], [place].

## 2012-04-24 LAB — URINE CULTURE

## 2012-04-29 ENCOUNTER — Encounter: Payer: Self-pay | Admitting: *Deleted

## 2012-05-04 ENCOUNTER — Encounter (HOSPITAL_COMMUNITY): Payer: Self-pay | Admitting: *Deleted

## 2012-05-04 ENCOUNTER — Inpatient Hospital Stay (HOSPITAL_COMMUNITY)
Admission: AD | Admit: 2012-05-04 | Discharge: 2012-05-04 | Disposition: A | Discharge status: Home / Self Care | Payer: Medicaid Other | Source: Ambulatory Visit | Attending: Obstetrics & Gynecology | Admitting: Obstetrics & Gynecology

## 2012-05-04 DIAGNOSIS — R143 Flatulence: Secondary | ICD-10-CM

## 2012-05-04 DIAGNOSIS — R141 Gas pain: Secondary | ICD-10-CM

## 2012-05-04 DIAGNOSIS — O99891 Other specified diseases and conditions complicating pregnancy: Secondary | ICD-10-CM | POA: Insufficient documentation

## 2012-05-04 DIAGNOSIS — IMO0001 Reserved for inherently not codable concepts without codable children: Secondary | ICD-10-CM

## 2012-05-04 DIAGNOSIS — R1012 Left upper quadrant pain: Secondary | ICD-10-CM | POA: Insufficient documentation

## 2012-05-04 LAB — URINALYSIS, ROUTINE W REFLEX MICROSCOPIC
Ketones, ur: NEGATIVE mg/dL
Leukocytes, UA: NEGATIVE
Nitrite: NEGATIVE
pH: 7 (ref 5.0–8.0)

## 2012-05-04 MED ORDER — SIMETHICONE 80 MG PO CHEW
160.0000 mg | CHEWABLE_TABLET | Freq: Once | ORAL | Status: AC
  Administered 2012-05-04: 160 mg via ORAL
  Filled 2012-05-04: qty 2

## 2012-05-04 MED ORDER — SIMETHICONE 80 MG PO CHEW: 80.0000 mg | CHEWABLE_TABLET | Freq: Four times a day (QID) | ORAL | Status: DC | PRN

## 2012-05-04 MED ORDER — FAMOTIDINE 10 MG PO CHEW: 20.0000 mg | CHEWABLE_TABLET | Freq: Two times a day (BID) | ORAL | Status: DC

## 2012-05-04 MED ORDER — HYDROCODONE-ACETAMINOPHEN 5-325 MG PO TABS
1.0000 | ORAL_TABLET | Freq: Once | ORAL | Status: AC
  Administered 2012-05-04: 1 via ORAL
  Filled 2012-05-04: qty 1

## 2012-05-04 MED ORDER — FAMOTIDINE 20 MG PO TABS
40.0000 mg | ORAL_TABLET | Freq: Once | ORAL | Status: AC
  Administered 2012-05-04: 40 mg via ORAL
  Filled 2012-05-04: qty 2

## 2012-05-04 NOTE — MAU Note (Signed)
Pt arrived via EMS for abd pain that started around 1200.

## 2012-05-04 NOTE — MAU Note (Signed)
Crackers given. 

## 2012-05-04 NOTE — MAU Note (Signed)
Monitors D/C per CNM.

## 2012-05-04 NOTE — MAU Provider Note (Signed)
Chief Complaint:  Abdominal Pain   First Provider Initiated Contact with Patient 05/04/12 2119      HPI: Debra Jensen is a 22 y.o. G3P2002 at [redacted]w[redacted]d who presents to maternity admissions reporting LUQ pain beginning around 12 PM today after having something to eat.  States she is having intermittent abdominal pain lasting for several minutes before ceasing for around 10 minutes.  It starts in the LUQ and radiates around to the back but denies pain in any other region.  Denies diarrhea, constipation, vomiting, decreased oral intake, fever, chills, or sweats.  Denies HA, scotoma, lightheaded, or dizziness. Also, no SOB, no pleuritic CP, no brash.    Denies contractions, leakage of fluid or vaginal bleeding. Good fetal movement.   Pregnancy Course:   Past Medical History: Past Medical History  Diagnosis Date  . Scoliosis   . Back pain, chronic   . Anemia     with preg  . Urinary tract infection   . Chlamydia   . Reflux   . Headache     Past obstetric history: OB History    Grav Para Term Preterm Abortions TAB SAB Ect Mult Living   3 2 2  0 0 0 0 0 0 2     # Outc Date GA Lbr Len/2nd Wgt Sex Del Anes PTL Lv   1 TRM 6/07   6lb14oz(3.118kg) M LTCS  No Yes   Comments: FH slowed after getting epid   2 TRM 3/12   7lb1oz(3.204kg) M VBAC  No Yes   3 CUR               Past Surgical History: Past Surgical History  Procedure Date  . Cesarean section   . Back surgery     harrington rods    Family History: Family History  Problem Relation Age of Onset  . Other Neg Hx   . Hypertension Other   . Hypertension Mother   . Cancer Mother   . Cancer Paternal Uncle     Social History: History  Substance Use Topics  . Smoking status: Current Every Day Smoker -- 0.2 packs/day for 4 years    Types: Cigarettes  . Smokeless tobacco: Never Used  . Alcohol Use: No     Comment: occasional/social    Allergies:  Allergies  Allergen Reactions  . Naproxen Hives  . Motrin (Ibuprofen)  Rash and Other (See Comments)    Redness and bumps on face    Meds:  Prescriptions prior to admission  Medication Sig Dispense Refill  . cyclobenzaprine (FLEXERIL) 5 MG tablet Take 1 tablet (5 mg total) by mouth every 8 (eight) hours as needed for muscle spasms.  30 tablet  0  . oxyCODONE-acetaminophen (PERCOCET/ROXICET) 5-325 MG per tablet Take 1 tablet by mouth every 4 (four) hours as needed for pain.  15 tablet  0  . Prenatal Multivit-Min-Fe-FA (PRENATAL VITAMINS) 0.8 MG tablet Take 1 tablet by mouth daily.  30 tablet  12  . calcium carbonate (TUMS - DOSED IN MG ELEMENTAL CALCIUM) 500 MG chewable tablet Chew 1 tablet by mouth daily as needed. For heartburn      . promethazine (PHENERGAN) 25 MG tablet Take 25 mg by mouth every 6 (six) hours as needed. Nausea        ROS: Pertinent findings in history of present illness.  Physical Exam  Blood pressure 109/56, pulse 82, temperature 98 F (36.7 C), temperature source Oral, resp. rate 18, height 5\' 2"  (1.575 m), weight 60.328  kg (133 lb), last menstrual period 11/13/2011, unknown if currently breastfeeding. GENERAL: WN/WD, Mild Distress HEENT: normocephalic, EOMI B/L  HEART: RRR, no murmurs appreciated  RESP: CTAB ABDOMEN: TTP LUQ, No CVA tenderness, NABS  EXTREMITIES: No edema  NEURO: alert and oriented SPECULUM EXAM: NEFG, physiologic discharge, no blood, cervix clean    FHT:  Baseline 150 , moderate variability, accelerations present, no decelerations Contractions: None    Labs: Results for orders placed during the hospital encounter of 05/04/12 (from the past 24 hour(s))  URINALYSIS, ROUTINE W REFLEX MICROSCOPIC     Status: Normal   Collection Time   05/04/12  8:40 PM      Component Value Range   Color, Urine YELLOW  YELLOW   APPearance CLEAR  CLEAR   Specific Gravity, Urine 1.020  1.005 - 1.030   pH 7.0  5.0 - 8.0   Glucose, UA NEGATIVE  NEGATIVE mg/dL   Hgb urine dipstick NEGATIVE  NEGATIVE   Bilirubin Urine NEGATIVE   NEGATIVE   Ketones, ur NEGATIVE  NEGATIVE mg/dL   Protein, ur NEGATIVE  NEGATIVE mg/dL   Urobilinogen, UA 0.2  0.0 - 1.0 mg/dL   Nitrite NEGATIVE  NEGATIVE   Leukocytes, UA NEGATIVE  NEGATIVE    Imaging:  US Ob Detail + 14 Wk  04/09/2012  OBSTETRICAL ULTRASOUND: This exam was performed within a [facility]. The OB US report was generated in the [facility] system, and faxed to the ordering physician.   This report is also available in [employer] and in the [employer]. See [facility] Obstetric US report.   MAU Course:1) Will go ahead with Pepcid AC for possible GERD as well as one time dose of Vicodin for lower rib pain.  Will do cervical check and if no abnormalities, will send home with continued pre-natal care.    Assessment: 1)LUQ pain - Possible etiologies include GERD/Flatulance and MSK pain   Plan: Discharge home Tylenol for pain, Mylicon, and Pepcid AC for possible reflux, flatulence  Labor precautions and fetal kick counts    Medication List     As of 05/04/2012 11:51 PM    TAKE these medications         calcium carbonate 500 MG chewable tablet   Commonly known as: TUMS - dosed in mg elemental calcium   Chew 1 tablet by mouth daily as needed. For heartburn      cyclobenzaprine 5 MG tablet   Commonly known as: FLEXERIL   Take 1 tablet (5 mg total) by mouth every 8 (eight) hours as needed for muscle spasms.      famotidine 10 MG chewable tablet   Commonly known as: PEPCID AC   Chew 2 tablets (20 mg total) by mouth 2 (two) times daily.      oxyCODONE-acetaminophen 5-325 MG per tablet   Commonly known as: PERCOCET/ROXICET   Take 1 tablet by mouth every 4 (four) hours as needed for pain.      PRENATAL VITAMINS 0.8 MG tablet   Take 1 tablet by mouth daily.      promethazine 25 MG tablet   Commonly known as: PHENERGAN   Take 25 mg by mouth every 6 (six) hours as needed. Nausea      simethicone 80 MG chewable tablet   Commonly known  as: MYLICON   Chew 1 tablet (80 mg total) by mouth every 6 (six) hours as needed for flatulence.          Twana First  Hess, DO 05/04/2012 9:24 PM  I have seen the patient with the resident and agree with the plan.

## 2012-06-16 ENCOUNTER — Inpatient Hospital Stay (HOSPITAL_COMMUNITY)
Admission: AD | Admit: 2012-06-16 | Discharge: 2012-06-16 | Disposition: A | Discharge status: Home / Self Care | Payer: Medicaid Other | Source: Ambulatory Visit | Attending: Obstetrics & Gynecology | Admitting: Obstetrics & Gynecology

## 2012-06-16 ENCOUNTER — Encounter (HOSPITAL_COMMUNITY): Payer: Self-pay | Admitting: *Deleted

## 2012-06-16 DIAGNOSIS — R109 Unspecified abdominal pain: Secondary | ICD-10-CM | POA: Insufficient documentation

## 2012-06-16 DIAGNOSIS — M5380 Other specified dorsopathies, site unspecified: Secondary | ICD-10-CM | POA: Insufficient documentation

## 2012-06-16 DIAGNOSIS — O212 Late vomiting of pregnancy: Secondary | ICD-10-CM | POA: Insufficient documentation

## 2012-06-16 DIAGNOSIS — O34219 Maternal care for unspecified type scar from previous cesarean delivery: Secondary | ICD-10-CM

## 2012-06-16 DIAGNOSIS — E860 Dehydration: Secondary | ICD-10-CM | POA: Insufficient documentation

## 2012-06-16 DIAGNOSIS — A088 Other specified intestinal infections: Secondary | ICD-10-CM | POA: Insufficient documentation

## 2012-06-16 DIAGNOSIS — O99891 Other specified diseases and conditions complicating pregnancy: Secondary | ICD-10-CM | POA: Insufficient documentation

## 2012-06-16 DIAGNOSIS — M549 Dorsalgia, unspecified: Secondary | ICD-10-CM | POA: Insufficient documentation

## 2012-06-16 LAB — CBC
MCH: 29.1 pg (ref 26.0–34.0)
MCHC: 33.3 g/dL (ref 30.0–36.0)
Platelets: 156 10*3/uL (ref 150–400)
RBC: 3.58 MIL/uL — ABNORMAL LOW (ref 3.87–5.11)

## 2012-06-16 LAB — RAPID URINE DRUG SCREEN, HOSP PERFORMED: Opiates: NOT DETECTED

## 2012-06-16 LAB — URINALYSIS, ROUTINE W REFLEX MICROSCOPIC
Bilirubin Urine: NEGATIVE
Ketones, ur: >80 mg/dL — AB
Nitrite: NEGATIVE
Specific Gravity, Urine: 1.025 (ref 1.005–1.030)
Urobilinogen, UA: 0.2 mg/dL (ref 0.0–1.0)

## 2012-06-16 LAB — COMPREHENSIVE METABOLIC PANEL
Alkaline Phosphatase: 143 U/L — ABNORMAL HIGH (ref 39–117)
BUN: 7 mg/dL (ref 6–23)
Chloride: 105 mEq/L (ref 96–112)
GFR calc Af Amer: >90 mL/min (ref >90)
Glucose, Bld: 71 mg/dL (ref 70–99)
Potassium: 3.6 mEq/L (ref 3.5–5.1)
Total Bilirubin: 0.3 mg/dL (ref 0.3–1.2)

## 2012-06-16 MED ORDER — ONDANSETRON 8 MG PO TBDP
8.0000 mg | ORAL_TABLET | Freq: Once | ORAL | Status: AC
  Administered 2012-06-16: 8 mg via ORAL
  Filled 2012-06-16: qty 1

## 2012-06-16 MED ORDER — ACETAMINOPHEN-CODEINE #3 300-30 MG PO TABS: 1.0000 | ORAL_TABLET | Freq: Four times a day (QID) | ORAL | Status: DC | PRN

## 2012-06-16 MED ORDER — ONDANSETRON HCL 8 MG PO TABS: 8.0000 mg | ORAL_TABLET | Freq: Three times a day (TID) | ORAL | Status: DC | PRN

## 2012-06-16 MED ORDER — CYCLOBENZAPRINE HCL 10 MG PO TABS: 10.0000 mg | ORAL_TABLET | Freq: Three times a day (TID) | ORAL | Status: DC | PRN

## 2012-06-16 MED ORDER — OXYCODONE-ACETAMINOPHEN 5-325 MG PO TABS: 2.0000 | ORAL_TABLET | Freq: Once | ORAL | Status: DC

## 2012-06-16 MED ORDER — OXYCODONE-ACETAMINOPHEN 5-325 MG PO TABS
2.0000 | ORAL_TABLET | Freq: Once | ORAL | Status: AC
  Administered 2012-06-16: 2 via ORAL
  Filled 2012-06-16: qty 2

## 2012-06-16 MED ORDER — LACTATED RINGERS IV BOLUS (SEPSIS)
1000.0000 mL | Freq: Once | INTRAVENOUS | Status: AC
  Administered 2012-06-16: 1000 mL via INTRAVENOUS

## 2012-06-16 NOTE — MAU Note (Signed)
Patient states she started vomiting at 0430 this am, having abdominal pain. No diarrhea. Unsure of fetal movement due to the pain. Denies leaking or bleeding.

## 2012-06-16 NOTE — MAU Provider Note (Signed)
History     CSN: [id number]  Arrival date and time: 06/16/12 1558   None     Chief Complaint  Patient presents with  . Emesis During Pregnancy  . Abdominal Pain   HPI 22 year old G3P2002 @ 30w 6d by LMP who presents with vomiting, abdominal pain, and back pain. The vomiting started suddenly this morning when she was sleeping. She had vomiting more times that she can remember. It is non bloody and mainly mucous. It is not associated with diarrhea. She feels warm, but has not taken her temperature. Her PO intake has been reduce to only 12 ounces of gatorade all day. Her son is sick with persistent vomiting and diarrhea. She is having severe abdominal pain, but she does not believe that it is uterine contractions. She denies vaginal bleeding or LOF. She is also concerned about severe back pain that she believes is related to the baby moving in her pelvis. She has a history of spinal surgery.    Prenatal Treatment - Of note, the patient has only been seen once in the outpatient clinic for pre-natal care. She states this is because a delay in her Medicaid.    OB History   Grav Para Term Preterm Abortions TAB SAB Ect Mult Living   3 2 2  0 0 0 0 0 0 2      Past Medical History  Diagnosis Date  . Scoliosis   . Back pain, chronic   . Anemia     with preg  . Urinary tract infection   . Chlamydia   . Reflux   . Headache     Past Surgical History  Procedure Laterality Date  . Cesarean section    . Back surgery      harrington rods    Family History  Problem Relation Age of Onset  . Other Neg Hx   . Hypertension Other   . Hypertension Mother   . Cancer Mother   . Cancer Paternal Uncle     History  Substance Use Topics  . Smoking status: Current Every Day Smoker -- 0.25 packs/day for 4 years    Types: Cigarettes  . Smokeless tobacco: Never Used  . Alcohol Use: No     Comment: occasional/social    Allergies:  Allergies  Allergen Reactions  . Naproxen Hives  .  Motrin (Ibuprofen) Rash and Other (See Comments)    Redness and bumps on face    Prescriptions prior to admission  Medication Sig Dispense Refill  . calcium carbonate (TUMS - DOSED IN MG ELEMENTAL CALCIUM) 500 MG chewable tablet Chew 1 tablet by mouth daily as needed. For heartburn      . cyclobenzaprine (FLEXERIL) 5 MG tablet Take 1 tablet (5 mg total) by mouth every 8 (eight) hours as needed for muscle spasms.  30 tablet  0  . famotidine (PEPCID AC) 10 MG chewable tablet Chew 2 tablets (20 mg total) by mouth 2 (two) times daily.  30 tablet  0  . oxyCODONE-acetaminophen (PERCOCET/ROXICET) 5-325 MG per tablet Take 1 tablet by mouth every 4 (four) hours as needed for pain.  15 tablet  0  . Prenatal Multivit-Min-Fe-FA (PRENATAL VITAMINS) 0.8 MG tablet Take 1 tablet by mouth daily.  30 tablet  12  . promethazine (PHENERGAN) 25 MG tablet Take 25 mg by mouth every 6 (six) hours as needed. Nausea      . simethicone (GAS-X) 80 MG chewable tablet Chew 1 tablet (80 mg total) by mouth  every 6 (six) hours as needed for flatulence.  30 tablet  0    Review of Systems  Constitutional: Positive for chills and malaise/fatigue.  Eyes: Negative.   Respiratory: Negative.   Cardiovascular: Negative.   Gastrointestinal: Positive for nausea, vomiting and abdominal pain. Negative for diarrhea, blood in stool and melena.  Genitourinary: Negative.   Musculoskeletal: Positive for back pain.  Skin: Positive for itching and rash.  Neurological: Positive for headaches.  Endo/Heme/Allergies: Bruises/bleeds easily.   Physical Exam   Blood pressure 110/55, pulse 106, temperature 98.4 F (36.9 C), temperature source Oral, resp. rate 20, height 5' 3.5" (1.613 m), weight 64.411 kg (142 lb), last menstrual period 11/13/2011, SpO2 98.00%.  Physical Exam  Constitutional: She appears ill.  HENT:  Mouth/Throat: Mucous membranes are pale and dry.  Cardiovascular: Regular rhythm, normal heart sounds and normal pulses.   Tachycardia present.   Respiratory: Effort normal and breath sounds normal.  GI: There is tenderness in the right upper quadrant and right lower quadrant. There is no rigidity and no guarding.  Gravid  Musculoskeletal:       Lumbar back: She exhibits decreased range of motion and tenderness.  Skin: Rash noted. Rash is macular.  Macular skin rash on extremities    MAU Course  Procedures  MDM FHM - 150, moderate variability, accels present, no decels, Cat 1 tracing Toco - no contractions in 60 min  Patient give zofran, 1L LR bolus, and percocet with marked improvement  Assessment and Plan  22 year old G70P2002 @ [redacted]w[redacted]d EGA who presented with symptoms and signs of viral gastroenteritis and dehydration. After fluids resuscitation and anti-nausea medication, the patient was markedly improved.  1. Viral Gastroenteritis - Instructed to take zofran and increase water intake 2. Lumbar Spasm in setting of severe scoliosis s/p rod placement - given flexeril and tylenol #3 for home pain relief 3. Prenatal Care - CBC, HIV, RPR drawn today; patient instructed to follow up in [facility] within one week, she is agreeable.   Mat Carne 06/16/2012, 4:48 PM   Seen and examined by me also Agree with note Wynelle Bourgeois CNM

## 2012-06-17 ENCOUNTER — Encounter: Payer: Self-pay | Admitting: Family Medicine

## 2012-06-17 DIAGNOSIS — O99320 Drug use complicating pregnancy, unspecified trimester: Secondary | ICD-10-CM | POA: Insufficient documentation

## 2012-06-17 LAB — HIV ANTIBODY (ROUTINE TESTING W REFLEX): HIV: NONREACTIVE

## 2012-06-18 ENCOUNTER — Other Ambulatory Visit: Payer: Self-pay | Admitting: Advanced Practice Midwife

## 2012-06-18 ENCOUNTER — Ambulatory Visit (INDEPENDENT_AMBULATORY_CARE_PROVIDER_SITE_OTHER): Payer: Self-pay | Admitting: Advanced Practice Midwife

## 2012-06-18 VITALS — BP 112/66 | Temp 97.6°F | Wt 141.0 lb

## 2012-06-18 DIAGNOSIS — F1920 Other psychoactive substance dependence, uncomplicated: Secondary | ICD-10-CM

## 2012-06-18 LAB — POCT URINALYSIS DIP (DEVICE)
Glucose, UA: NEGATIVE mg/dL
Ketones, ur: NEGATIVE mg/dL
Protein, ur: 30 mg/dL — AB

## 2012-06-18 NOTE — Progress Notes (Signed)
Feeling well. Feeling a lot of pelvic pressure she is worried that the "baby is going to come out". Denies any UC, VB or LOF.

## 2012-06-18 NOTE — Progress Notes (Signed)
Pulse 89 C/o lower back pain and pressure in vaginal area. Patient unable to do the 1 hr glucola test; request to do next office visit.

## 2012-07-02 ENCOUNTER — Encounter: Payer: Self-pay | Admitting: Advanced Practice Midwife

## 2012-07-09 ENCOUNTER — Encounter: Payer: Self-pay | Admitting: Family Medicine

## 2012-07-09 ENCOUNTER — Ambulatory Visit (INDEPENDENT_AMBULATORY_CARE_PROVIDER_SITE_OTHER): Payer: Self-pay | Admitting: Family Medicine

## 2012-07-09 ENCOUNTER — Other Ambulatory Visit: Payer: Self-pay | Admitting: Advanced Practice Midwife

## 2012-07-09 VITALS — BP 114/73 | Temp 97.0°F | Wt 145.2 lb

## 2012-07-09 DIAGNOSIS — R8281 Pyuria: Secondary | ICD-10-CM

## 2012-07-09 DIAGNOSIS — O34219 Maternal care for unspecified type scar from previous cesarean delivery: Secondary | ICD-10-CM

## 2012-07-09 DIAGNOSIS — Z302 Encounter for sterilization: Secondary | ICD-10-CM

## 2012-07-09 DIAGNOSIS — M545 Low back pain, unspecified: Secondary | ICD-10-CM

## 2012-07-09 DIAGNOSIS — R300 Dysuria: Secondary | ICD-10-CM

## 2012-07-09 DIAGNOSIS — R82998 Other abnormal findings in urine: Secondary | ICD-10-CM

## 2012-07-09 DIAGNOSIS — O0992 Supervision of high risk pregnancy, unspecified, second trimester: Secondary | ICD-10-CM

## 2012-07-09 DIAGNOSIS — L309 Dermatitis, unspecified: Secondary | ICD-10-CM | POA: Insufficient documentation

## 2012-07-09 DIAGNOSIS — L259 Unspecified contact dermatitis, unspecified cause: Secondary | ICD-10-CM

## 2012-07-09 DIAGNOSIS — O0990 Supervision of high risk pregnancy, unspecified, unspecified trimester: Secondary | ICD-10-CM

## 2012-07-09 LAB — POCT URINALYSIS DIP (DEVICE)
Glucose, UA: NEGATIVE mg/dL
Specific Gravity, Urine: 1.025 (ref 1.005–1.030)
Urobilinogen, UA: 0.2 mg/dL (ref 0.0–1.0)

## 2012-07-09 MED ORDER — TRIAMCINOLONE ACETONIDE 0.5 % EX OINT: TOPICAL_OINTMENT | Freq: Two times a day (BID) | CUTANEOUS | Status: DC

## 2012-07-09 MED ORDER — ACETAMINOPHEN-CODEINE #3 300-30 MG PO TABS: 1.0000 | ORAL_TABLET | Freq: Four times a day (QID) | ORAL | Status: DC | PRN

## 2012-07-09 NOTE — Progress Notes (Signed)
C/o increasing eczema--Kenalog, Dove, Eucerin C/o back pain from spasm related to scoliosis--using heat, flexeril, needs Tylenol#3-rx given. + nitrates in urine--will check culture. Desires Repeat C-section and BTL--papers cannot be signed yet, since Medicaid is pending--please sign next visit.

## 2012-07-09 NOTE — Patient Instructions (Addendum)
Eczema Atopic dermatitis, or eczema, is an inherited type of sensitive skin. Often people with eczema have a family history of allergies, asthma, or hay fever. It causes a red itchy rash and dry scaly skin. The itchiness may occur before the skin rash and may be very intense. It is not contagious. Eczema is generally worse during the cooler winter months and often improves with the warmth of summer. Eczema usually starts showing signs in infancy. Some children outgrow eczema, but it may last through adulthood. Flare-ups may be caused by:  Eating something or contact with something you are sensitive or allergic to.  Stress. DIAGNOSIS  The diagnosis of eczema is usually based upon symptoms and medical history. TREATMENT  Eczema cannot be cured, but symptoms usually can be controlled with treatment or avoidance of allergens (things to which you are sensitive or allergic to).  Controlling the itching and scratching.  Use over-the-counter antihistamines as directed for itching. It is especially useful at night when the itching tends to be worse.  Use over-the-counter steroid creams as directed for itching.  Scratching makes the rash and itching worse and may cause impetigo (a skin infection) if fingernails are contaminated (dirty).  Keeping the skin well moisturized with creams every day. This will seal in moisture and help prevent dryness. Lotions containing alcohol and water can dry the skin and are not recommended. Use Lubriderm or Eucerin cream.  Switch to [employer].  Limiting exposure to allergens.  Recognizing situations that cause stress.  Developing a plan to manage stress. HOME CARE INSTRUCTIONS   Take prescription and over-the-counter medicines as directed by your caregiver.  Do not use anything on the skin without checking with your caregiver.  Keep baths or showers short (5 minutes) in warm (not hot) water. Use mild cleansers for bathing. You may add non-perfumed bath oil to  the bath water. It is best to avoid soap and bubble bath.  Immediately after a bath or shower, when the skin is still damp, apply a moisturizing ointment to the entire body. This ointment should be a petroleum ointment. This will seal in moisture and help prevent dryness. The thicker the ointment the better. These should be unscented.  Keep fingernails cut short and wash hands often. If your child has eczema, it may be necessary to put soft gloves or mittens on your child at night.  Dress in clothes made of cotton or cotton blends. Dress lightly, as heat increases itching.  Avoid foods that may cause flare-ups. Common foods include cow's milk, peanut butter, eggs and wheat.  Keep a child with eczema away from anyone with fever blisters. The virus that causes fever blisters (herpes simplex) can cause a serious skin infection in children with eczema. SEEK MEDICAL CARE IF:   Itching interferes with sleep.  The rash gets worse or is not better within one week following treatment.  The rash looks infected (pus or soft yellow scabs).  You or your child has an oral temperature above 102 F (38.9 C).  Your baby is older than 3 months with a rectal temperature of 100.5 F (38.1 C) or higher for more than 1 day.  The rash flares up after contact with someone who has fever blisters. SEEK IMMEDIATE MEDICAL CARE IF:   Your baby is older than 3 months with a rectal temperature of 102 F (38.9 C) or higher.  Your baby is older than 3 months or younger with a rectal temperature of 100.4 F (38 C) or higher.  Document Released: 04/06/2000 Document Revised: 07/02/2011 Document Reviewed: 02/09/2009 [facility] Patient Information 2013 [place], [place].  Pregnancy - Third Trimester The third trimester of pregnancy (the last 3 months) is a period of the most rapid growth for you and your baby. The baby approaches a length of 20 inches and a weight of 6 to 10 pounds. The baby is adding on fat and getting  ready for life outside your body. While inside, babies have periods of sleeping and waking, suck their thumbs, and hiccups. You can often feel small contractions of the uterus. This is false labor. It is also called Braxton-Hicks contractions. This is like a practice for labor. The usual problems in this stage of pregnancy include more difficulty breathing, swelling of the hands and feet from water retention, and having to urinate more often because of the uterus and baby pressing on your bladder.  PRENATAL EXAMS  Blood work may continue to be done during prenatal exams. These tests are done to check on your health and the probable health of your baby. Blood work is used to follow your blood levels (hemoglobin). Anemia (low hemoglobin) is common during pregnancy. Iron and vitamins are given to help prevent this. You may also continue to be checked for diabetes. Some of the past blood tests may be done again.  The size of the uterus is measured during each visit. This makes sure your baby is growing properly according to your pregnancy dates.  Your blood pressure is checked every prenatal visit. This is to make sure you are not getting toxemia.  Your urine is checked every prenatal visit for infection, diabetes and protein.  Your weight is checked at each visit. This is done to make sure gains are happening at the suggested rate and that you and your baby are growing normally.  Sometimes, an ultrasound is performed to confirm the position and the proper growth and development of the baby. This is a test done that bounces harmless sound waves off the baby so your caregiver can more accurately determine due dates.  Discuss the type of pain medication and anesthesia you will have during your labor and delivery.  Discuss the possibility and anesthesia if a Cesarean Section might be necessary.  Inform your caregiver if there is any mental or physical violence at home. Sometimes, a specialized  non-stress test, contraction stress test and biophysical profile are done to make sure the baby is not having a problem. Checking the amniotic fluid surrounding the baby is called an amniocentesis. The amniotic fluid is removed by sticking a needle into the belly (abdomen). This is sometimes done near the end of pregnancy if an early delivery is required. In this case, it is done to help make sure the baby's lungs are mature enough for the baby to live outside of the womb. If the lungs are not mature and it is unsafe to deliver the baby, an injection of cortisone medication is given to the mother 1 to 2 days before the delivery. This helps the baby's lungs mature and makes it safer to deliver the baby. CHANGES OCCURING IN THE THIRD TRIMESTER OF PREGNANCY Your body goes through many changes during pregnancy. They vary from person to person. Talk to your caregiver about changes you notice and are concerned about.  During the last trimester, you have probably had an increase in your appetite. It is normal to have cravings for certain foods. This varies from person to person and pregnancy to pregnancy.  You may begin  to get stretch marks on your hips, abdomen, and breasts. These are normal changes in the body during pregnancy. There are no exercises or medications to take which prevent this change.  Constipation may be treated with a stool softener or adding bulk to your diet. Drinking lots of fluids, fiber in vegetables, fruits, and whole grains are helpful.  Exercising is also helpful. If you have been very active up until your pregnancy, most of these activities can be continued during your pregnancy. If you have been less active, it is helpful to start an exercise program such as walking. Consult your caregiver before starting exercise programs.  Avoid all smoking, alcohol, un-prescribed drugs, herbs and "street drugs" during your pregnancy. These chemicals affect the formation and growth of the baby.  Avoid chemicals throughout the pregnancy to ensure the delivery of a healthy infant.  Backache, varicose veins and hemorrhoids may develop or get worse.  You will tire more easily in the third trimester, which is normal.  The baby's movements may be stronger and more often.  You may become short of breath easily.  Your belly button may stick out.  A yellow discharge may leak from your breasts called colostrum.  You may have a bloody mucus discharge. This usually occurs a few days to a week before labor begins. HOME CARE INSTRUCTIONS   Keep your caregiver's appointments. Follow your caregiver's instructions regarding medication use, exercise, and diet.  During pregnancy, you are providing food for you and your baby. Continue to eat regular, well-balanced meals. Choose foods such as meat, fish, milk and other low fat dairy products, vegetables, fruits, and whole-grain breads and cereals. Your caregiver will tell you of the ideal weight gain.  A physical sexual relationship may be continued throughout pregnancy if there are no other problems such as early (premature) leaking of amniotic fluid from the membranes, vaginal bleeding, or belly (abdominal) pain.  Exercise regularly if there are no restrictions. Check with your caregiver if you are unsure of the safety of your exercises. Greater weight gain will occur in the last 2 trimesters of pregnancy. Exercising helps:  Control your weight.  Get you in shape for labor and delivery.  You lose weight after you deliver.  Rest a lot with legs elevated, or as needed for leg cramps or low back pain.  Wear a good support or jogging bra for breast tenderness during pregnancy. This may help if worn during sleep. Pads or tissues may be used in the bra if you are leaking colostrum.  Do not use hot tubs, steam rooms, or saunas.  Wear your seat belt when driving. This protects you and your baby if you are in an accident.  Avoid raw meat, cat  litter boxes and soil used by cats. These carry germs that can cause birth defects in the baby.  It is easier to loose urine during pregnancy. Tightening up and strengthening the pelvic muscles will help with this problem. You can practice stopping your urination while you are going to the bathroom. These are the same muscles you need to strengthen. It is also the muscles you would use if you were trying to stop from passing gas. You can practice tightening these muscles up 10 times a set and repeating this about 3 times per day. Once you know what muscles to tighten up, do not perform these exercises during urination. It is more likely to cause an infection by backing up the urine.  Ask for help if you have  financial, counseling or nutritional needs during pregnancy. Your caregiver will be able to offer counseling for these needs as well as refer you for other special needs.  Make a list of emergency phone numbers and have them available.  Plan on getting help from family or friends when you go home from the hospital.  Make a trial run to the hospital.  Take prenatal classes with the father to understand, practice and ask questions about the labor and delivery.  Prepare the baby's room/nursery.  Do not travel out of the city unless it is absolutely necessary and with the advice of your caregiver.  Wear only low or no heal shoes to have better balance and prevent falling. MEDICATIONS AND DRUG USE IN PREGNANCY  Take prenatal vitamins as directed. The vitamin should contain 1 milligram of folic acid. Keep all vitamins out of reach of children. Only a couple vitamins or tablets containing iron may be fatal to a baby or young child when ingested.  Avoid use of all medications, including herbs, over-the-counter medications, not prescribed or suggested by your caregiver. Only take over-the-counter or prescription medicines for pain, discomfort, or fever as directed by your caregiver. Do not use  aspirin, ibuprofen (Motrin, Advil, Nuprin) or naproxen (Aleve) unless OK'd by your caregiver.  Let your caregiver also know about herbs you may be using.  Alcohol is related to a number of birth defects. This includes fetal alcohol syndrome. All alcohol, in any form, should be avoided completely. Smoking will cause low birth rate and premature babies.  Street/illegal drugs are very harmful to the baby. They are absolutely forbidden. A baby born to an addicted mother will be addicted at birth. The baby will go through the same withdrawal an adult does. SEEK MEDICAL CARE IF: You have any concerns or worries during your pregnancy. It is better to call with your questions if you feel they cannot wait, rather than worry about them. DECISIONS ABOUT CIRCUMCISION You may or may not know the sex of your baby. If you know your baby is a boy, it may be time to think about circumcision. Circumcision is the removal of the foreskin of the penis. This is the skin that covers the sensitive end of the penis. There is no proven medical need for this. Often this decision is made on what is popular at the time or based upon religious beliefs and social issues. You can discuss these issues with your caregiver or pediatrician. SEEK IMMEDIATE MEDICAL CARE IF:   An unexplained oral temperature above 102 F (38.9 C) develops, or as your caregiver suggests.  You have leaking of fluid from the vagina (birth canal). If leaking membranes are suspected, take your temperature and tell your caregiver of this when you call.  There is vaginal spotting, bleeding or passing clots. Tell your caregiver of the amount and how many pads are used.  You develop a bad smelling vaginal discharge with a change in the color from clear to white.  You develop vomiting that lasts more than 24 hours.  You develop chills or fever.  You develop shortness of breath.  You develop burning on urination.  You loose more than 2 pounds of  weight or gain more than 2 pounds of weight or as suggested by your caregiver.  You notice sudden swelling of your face, hands, and feet or legs.  You develop belly (abdominal) pain. Round ligament discomfort is a common non-cancerous (benign) cause of abdominal pain in pregnancy. Your caregiver still must  evaluate you.  You develop a severe headache that does not go away.  You develop visual problems, blurred or double vision.  If you have not felt your baby move for more than 1 hour. If you think the baby is not moving as much as usual, eat something with sugar in it and lie down on your left side for an hour. The baby should move at least 4 to 5 times per hour. Call right away if your baby moves less than that.  You fall, are in a car accident or any kind of trauma.  There is mental or physical violence at home. Document Released: 04/03/2001 Document Revised: 07/02/2011 Document Reviewed: 10/06/2008 [facility] Patient Information 2013 [place], [place].  Breastfeeding Deciding to breastfeed is one of the best choices you can make for you and your baby. The information that follows gives a brief overview of the benefits of breastfeeding as well as common topics surrounding breastfeeding. BENEFITS OF BREASTFEEDING For the baby  The first milk (colostrum) helps the baby's digestive system function better.   There are antibodies in the mother's milk that help the baby fight off infections.   The baby has a lower incidence of asthma, allergies, and sudden infant death syndrome (SIDS).   The nutrients in breast milk are better for the baby than infant formulas, and breast milk helps the baby's brain grow better.   Babies who breastfeed have less gas, colic, and constipation.  For the mother  Breastfeeding helps develop a very special bond between the mother and her baby.   Breastfeeding is convenient, always available at the correct temperature, and costs nothing.    Breastfeeding burns calories in the mother and helps her lose weight that was gained during pregnancy.   Breastfeeding makes the uterus contract back down to normal size faster and slows bleeding following delivery.   Breastfeeding mothers have a lower risk of developing breast cancer.  BREASTFEEDING FREQUENCY  A healthy, full-term baby may breastfeed as often as every hour or space his or her feedings to every 3 hours.   Watch your baby for signs of hunger. Nurse your baby if he or she shows signs of hunger. How often you nurse will vary from baby to baby.   Nurse as often as the baby requests, or when you feel the need to reduce the fullness of your breasts.   Awaken the baby if it has been 3 4 hours since the last feeding.   Frequent feeding will help the mother make more milk and will help prevent problems, such as sore nipples and engorgement of the breasts.  BABY'S POSITION AT THE BREAST  Whether lying down or sitting, be sure that the baby's tummy is facing your tummy.   Support the breast with 4 fingers underneath the breast and the thumb above. Make sure your fingers are well away from the nipple and baby's mouth.   Stroke the baby's lips gently with your finger or nipple.   When the baby's mouth is open wide enough, place all of your nipple and as much of the areola as possible into your baby's mouth.   Pull the baby in close so the tip of the nose and the baby's cheeks touch the breast during the feeding.  FEEDINGS AND SUCTION  The length of each feeding varies from baby to baby and from feeding to feeding.   The baby must suck about 2 3 minutes for your milk to get to him or her. This is  called a "let down." For this reason, allow the baby to feed on each breast as long as he or she wants. Your baby will end the feeding when he or she has received the right balance of nutrients.   To break the suction, put your finger into the corner of the baby's  mouth and slide it between his or her gums before removing your breast from his or her mouth. This will help prevent sore nipples.  HOW TO TELL WHETHER YOUR BABY IS GETTING ENOUGH BREAST MILK. Wondering whether or not your baby is getting enough milk is a common concern among mothers. You can be assured that your baby is getting enough milk if:   Your baby is actively sucking and you hear swallowing.   Your baby seems relaxed and satisfied after a feeding.   Your baby nurses at least 8 12 times in a 24 hour time period. Nurse your baby until he or she unlatches or falls asleep at the first breast (at least 10 20 minutes), then offer the second side.   Your baby is wetting 5 6 disposable diapers (6 8 cloth diapers) in a 24 hour period by 67 24 days of age.   Your baby is having at least 3 4 stools every 24 hours for the first 6 weeks. The stool should be soft and yellow.   Your baby should gain 4 7 ounces per week after he or she is 74 days old.   Your breasts feel softer after nursing.  REDUCING BREAST ENGORGEMENT  In the first week after your baby is born, you may experience signs of breast engorgement. When breasts are engorged, they feel heavy, warm, full, and may be tender to the touch. You can reduce engorgement if you:   Nurse frequently, every 2 3 hours. Mothers who breastfeed early and often have fewer problems with engorgement.   Place light ice packs on your breasts for 10 20 minutes between feedings. This reduces swelling. Wrap the ice packs in a lightweight towel to protect your skin. Bags of frozen vegetables work well for this purpose.   Take a warm shower or apply warm, moist heat to your breast for 5 10 minutes just before each feeding. This increases circulation and helps the milk flow.   Gently massage your breast before and during the feeding. Using your finger tips, massage from the chest wall towards your nipple in a circular motion.   Make sure that the  baby empties at least one breast at every feeding before switching sides.   Use a breast pump to empty the breasts if your baby is sleepy or not nursing well. You may also want to pump if you are returning to work oryou feel you are getting engorged.   Avoid bottle feeds, pacifiers, or supplemental feedings of water or juice in place of breastfeeding. Breast milk is all the food your baby needs. It is not necessary for your baby to have water or formula. In fact, to help your breasts make more milk, it is best not to give your baby supplemental feedings during the early weeks.   Be sure the baby is latched on and positioned properly while breastfeeding.   Wear a supportive bra, avoiding underwire styles.   Eat a balanced diet with enough fluids.   Rest often, relax, and take your prenatal vitamins to prevent fatigue, stress, and anemia.  If you follow these suggestions, your engorgement should improve in 24 48 hours. If you are  still experiencing difficulty, call your lactation consultant or caregiver.  CARING FOR YOURSELF Take care of your breasts  Bathe or shower daily.   Avoid using soap on your nipples.   Start feedings on your left breast at one feeding and on your right breast at the next feeding.   You will notice an increase in your milk supply 2 5 days after delivery. You may feel some discomfort from engorgement, which makes your breasts very firm and often tender. Engorgement "peaks" out within 24 48 hours. In the meantime, apply warm moist towels to your breasts for 5 10 minutes before feeding. Gentle massage and expression of some milk before feeding will soften your breasts, making it easier for your baby to latch on.   Wear a well-fitting nursing bra, and air dry your nipples for a 3 after each feeding.   Only use cotton bra pads.   Only use pure lanolin on your nipples after nursing. You do not need to wash it off before feeding the baby again.  Another option is to express a few drops of breast milk and gently massage it into your nipples.  Take care of yourself  Eat well-balanced meals and nutritious snacks.   Drinking milk, fruit juice, and water to satisfy your thirst (about 8 glasses a day).   Get plenty of rest.  Avoid foods that you notice affect the baby in a bad way.  SEEK MEDICAL CARE IF:   You have difficulty with breastfeeding and need help.   You have a hard, red, sore area on your breast that is accompanied by a fever.   Your baby is too sleepy to eat well or is having trouble sleeping.   Your baby is wetting less than 6 diapers a day, by 53 days of age.   Your baby's skin or white part of his or her eyes is more yellow than it was in the hospital.   You feel depressed.  Document Released: 04/09/2005 Document Revised: 10/09/2011 Document Reviewed: 07/08/2011 [facility] Patient Information 2013 [place], [place].

## 2012-07-09 NOTE — Progress Notes (Signed)
Pt. Called in today and stated she was in so much pain in her back that she was unable to come in to the appointment.  She wanted an RX for Tylenol #3 to be called in for her.  It was advised per Dr. Thad Ranger and Wynelle Bourgeois, CNM for the patient to keep her appointment and be seen by a provider.  The patient agreed to come to her appointment today.

## 2012-07-11 ENCOUNTER — Telehealth: Payer: Self-pay | Admitting: *Deleted

## 2012-07-11 DIAGNOSIS — O2340 Unspecified infection of urinary tract in pregnancy, unspecified trimester: Secondary | ICD-10-CM

## 2012-07-11 NOTE — Telephone Encounter (Signed)
Message copied by Mannie Stabile on Fri Jul 11, 2012  8:39 AM ------      Message from: Reva Bores      Created: Fri Jul 11, 2012  8:11 AM       + urine culture--needs treatment with Keflex 500 mg qid x 7 d, # 28 NRF ------

## 2012-07-12 LAB — CULTURE, OB URINE: Colony Count: >=100000

## 2012-07-14 MED ORDER — CEPHALEXIN 500 MG PO CAPS: 500.0000 mg | ORAL_CAPSULE | Freq: Four times a day (QID) | ORAL | Status: DC

## 2012-07-14 NOTE — Telephone Encounter (Signed)
Pt informed of results. Rx sent to pharmacy.  

## 2012-07-23 ENCOUNTER — Ambulatory Visit (INDEPENDENT_AMBULATORY_CARE_PROVIDER_SITE_OTHER): Payer: Self-pay | Admitting: Obstetrics and Gynecology

## 2012-07-23 VITALS — BP 105/67 | Temp 97.3°F | Wt 146.0 lb

## 2012-07-23 DIAGNOSIS — M549 Dorsalgia, unspecified: Secondary | ICD-10-CM

## 2012-07-23 DIAGNOSIS — O9989 Other specified diseases and conditions complicating pregnancy, childbirth and the puerperium: Secondary | ICD-10-CM

## 2012-07-23 LAB — POCT URINALYSIS DIP (DEVICE)
Bilirubin Urine: NEGATIVE
Nitrite: POSITIVE — AB
Protein, ur: 30 mg/dL — AB
pH: 6.5 (ref 5.0–8.0)

## 2012-07-23 MED ORDER — ACETAMINOPHEN-CODEINE #3 300-30 MG PO TABS: 1.0000 | ORAL_TABLET | Freq: Four times a day (QID) | ORAL | Status: DC | PRN

## 2012-07-23 MED ORDER — ACETAMINOPHEN-CODEINE #3 300-30 MG PO TABS: 1.0000 | ORAL_TABLET | ORAL | Status: DC | PRN

## 2012-07-23 NOTE — Progress Notes (Signed)
Pulse 96  Edema trace in feet. C/o of pain on lower back.

## 2012-07-23 NOTE — Progress Notes (Signed)
Severe LBP in appareent discomfort. Out of Tyl#3 which she has taken for 2 wks. Rx #20. Abd support belt recommended. . Neg CVAT. GBS sent.

## 2012-07-26 LAB — CULTURE, BETA STREP (GROUP B ONLY)

## 2012-07-28 ENCOUNTER — Encounter (HOSPITAL_COMMUNITY): Payer: Self-pay | Admitting: Pharmacy Technician

## 2012-07-30 ENCOUNTER — Other Ambulatory Visit: Payer: Self-pay | Admitting: Family Medicine

## 2012-07-30 ENCOUNTER — Encounter: Payer: Self-pay | Admitting: Family Medicine

## 2012-07-30 ENCOUNTER — Ambulatory Visit (INDEPENDENT_AMBULATORY_CARE_PROVIDER_SITE_OTHER): Payer: Self-pay | Admitting: Family Medicine

## 2012-07-30 VITALS — BP 114/71 | Temp 98.3°F | Wt 149.2 lb

## 2012-07-30 DIAGNOSIS — O34219 Maternal care for unspecified type scar from previous cesarean delivery: Secondary | ICD-10-CM

## 2012-07-30 LAB — POCT URINALYSIS DIP (DEVICE)
Bilirubin Urine: NEGATIVE
Nitrite: NEGATIVE
Protein, ur: 30 mg/dL — AB
pH: 7 (ref 5.0–8.0)

## 2012-07-30 MED ORDER — ACETAMINOPHEN-CODEINE #3 300-30 MG PO TABS: 1.0000 | ORAL_TABLET | Freq: Four times a day (QID) | ORAL | Status: DC | PRN

## 2012-07-30 NOTE — Progress Notes (Signed)
Back pain unbearable - can't walk. Only Tylenol #3 helps, flexeril some help. Discussed risks to baby of withdrawal. Will refill today. Pt planning repeat c-section 4/22 but had VBAC last pregnancy and would consider VBAC depending if comes in in labor but prefers repeat c-section. Labor precautions reviewed, pt with increased dilation today but not contracting regularly.

## 2012-07-30 NOTE — Patient Instructions (Addendum)
Normal Labor and Delivery  Your caregiver must first be sure you are in labor. Signs of labor include:  · You may pass what is called "the mucus plug" before labor begins. This is a small amount of blood stained mucus.  · Regular uterine contractions.  · The time between contractions get closer together.  · The discomfort and pain gradually gets more intense.  · Pains are mostly located in the back.  · Pains get worse when walking.  · The cervix (the opening of the uterus becomes thinner (begins to efface) and opens up (dilates).  Once you are in labor and admitted into the hospital or care center, your caregiver will do the following:  · A complete physical examination.  · Check your vital signs (blood pressure, pulse, temperature and the fetal heart rate).  · Do a vaginal examination (using a sterile glove and lubricant) to determine:  · The position (presentation) of the baby (head [vertex] or buttock first).  · The level (station) of the baby's head in the birth canal.  · The effacement and dilatation of the cervix.  · You may have your pubic hair shaved and be given an enema depending on your caregiver and the circumstance.  · An electronic monitor is usually placed on your abdomen. The monitor follows the length and intensity of the contractions, as well as the baby's heart rate.  · Usually, your caregiver will insert an IV in your arm with a bottle of sugar water. This is done as a precaution so that medications can be given to you quickly during labor or delivery.  NORMAL LABOR AND DELIVERY IS DIVIDED UP INTO 3 STAGES:  First Stage  This is when regular contractions begin and the cervix begins to efface and dilate. This stage can last from 3 to 15 hours. The end of the first stage is when the cervix is 100% effaced and 10 centimeters dilated. Pain medications may be given by   · Injection (morphine, demerol, etc.)  · Regional anesthesia (spinal, caudal or epidural, anesthetics given in different locations of  the spine). Paracervical pain medication may be given, which is an injection of and anesthetic on each side of the cervix.  A pregnant woman may request to have "Natural Childbirth" which is not to have any medications or anesthesia during her labor and delivery.  Second Stage  This is when the baby comes down through the birth canal (vagina) and is born. This can take 1 to 4 hours. As the baby's head comes down through the birth canal, you may feel like you are going to have a bowel movement. You will get the urge to bear down and push until the baby is delivered. As the baby's head is being delivered, the caregiver will decide if an episiotomy (a cut in the perineum and vagina area) is needed to prevent tearing of the tissue in this area. The episiotomy is sewn up after the delivery of the baby and placenta. Sometimes a mask with nitrous oxide is given for the mother to breath during the delivery of the baby to help if there is too much pain. The end of Stage 2 is when the baby is fully delivered. Then when the umbilical cord stops pulsating it is clamped and cut.  Third Stage  The third stage begins after the baby is completely delivered and ends after the placenta (afterbirth) is delivered. This usually takes 5 to 30 minutes. After the placenta is delivered, a medication   is given either by intravenous or injection to help contract the uterus and prevent bleeding. The third stage is not painful and pain medication is usually not necessary. If an episiotomy was done, it is repaired at this time.  After the delivery, the mother is watched and monitored closely for 1 to 2 hours to make sure there is no postpartum bleeding (hemorrhage). If there is a lot of bleeding, medication is given to contract the uterus and stop the bleeding.  Document Released: 01/17/2008 Document Revised: 07/02/2011 Document Reviewed: 01/17/2008  ExitCare® Patient Information ©2013 [employer].

## 2012-08-01 ENCOUNTER — Inpatient Hospital Stay (HOSPITAL_COMMUNITY)
Admission: AD | Admit: 2012-08-01 | Discharge: 2012-08-01 | Disposition: A | Discharge status: Home / Self Care | Payer: Medicaid Other | Source: Ambulatory Visit | Attending: Obstetrics & Gynecology | Admitting: Obstetrics & Gynecology

## 2012-08-01 DIAGNOSIS — O471 False labor at or after 37 completed weeks of gestation: Secondary | ICD-10-CM

## 2012-08-01 DIAGNOSIS — M549 Dorsalgia, unspecified: Secondary | ICD-10-CM | POA: Insufficient documentation

## 2012-08-01 DIAGNOSIS — O479 False labor, unspecified: Secondary | ICD-10-CM | POA: Insufficient documentation

## 2012-08-01 DIAGNOSIS — O2343 Unspecified infection of urinary tract in pregnancy, third trimester: Secondary | ICD-10-CM

## 2012-08-01 DIAGNOSIS — O2390 Unspecified genitourinary tract infection in pregnancy, unspecified trimester: Secondary | ICD-10-CM | POA: Insufficient documentation

## 2012-08-01 DIAGNOSIS — N390 Urinary tract infection, site not specified: Secondary | ICD-10-CM | POA: Insufficient documentation

## 2012-08-01 LAB — URINALYSIS, ROUTINE W REFLEX MICROSCOPIC
Bilirubin Urine: NEGATIVE
Ketones, ur: 40 mg/dL — AB
Nitrite: POSITIVE — AB
Specific Gravity, Urine: 1.015 (ref 1.005–1.030)
Urobilinogen, UA: 1 mg/dL (ref 0.0–1.0)

## 2012-08-01 LAB — RAPID URINE DRUG SCREEN, HOSP PERFORMED: Barbiturates: NOT DETECTED

## 2012-08-01 MED ORDER — CEPHALEXIN 500 MG PO CAPS: 500.0000 mg | ORAL_CAPSULE | Freq: Three times a day (TID) | ORAL | Status: DC

## 2012-08-01 MED ORDER — ZOLPIDEM TARTRATE 5 MG PO TABS: 5.0000 mg | ORAL_TABLET | Freq: Every evening | ORAL | Status: DC | PRN

## 2012-08-01 MED ORDER — CYCLOBENZAPRINE HCL 10 MG PO TABS: 10.0000 mg | ORAL_TABLET | Freq: Two times a day (BID) | ORAL | Status: DC | PRN

## 2012-08-01 MED ORDER — NALBUPHINE HCL 10 MG/ML IJ SOLN
10.0000 mg | INTRAMUSCULAR | Status: DC | PRN
  Administered 2012-08-01: 10 mg via INTRAMUSCULAR
  Filled 2012-08-01: qty 1

## 2012-08-01 NOTE — MAU Provider Note (Signed)
History     CSN: [id number]  Arrival date and time: 08/01/12 1449   None     Chief Complaint  Patient presents with  . Contractions   HPI 22 y.o. G3P2002 at [redacted]w[redacted]d with contractions for past 6 hours. Not sure how frequent. Pain is lower abdomen/suprapubic and low back.  Seen 2 days ago in [facility] for prenatal visit. C/o back pain due to scoliosis with rod in back. On Tylenol #3 and flexeril. Was 3.5 cm dilated then.  OB History   Grav Para Term Preterm Abortions TAB SAB Ect Mult Living   3 2 2  0 0 0 0 0 0 2    Had c/section for NRFHTs with first baby and VBAC with second. Planning repeat c-section on 4/21.  Past Medical History  Diagnosis Date  . Scoliosis   . Back pain, chronic   . Anemia     with preg  . Urinary tract infection   . Chlamydia   . Reflux   . Headache     Past Surgical History  Procedure Laterality Date  . Cesarean section    . Back surgery      harrington rods    Family History  Problem Relation Age of Onset  . Other Neg Hx   . Hypertension Other   . Hypertension Mother   . Cancer Mother   . Cancer Paternal Uncle     History  Substance Use Topics  . Smoking status: Current Every Day Smoker -- 0.25 packs/day for 4 years    Types: Cigarettes  . Smokeless tobacco: Never Used  . Alcohol Use: No     Comment: occasional/social  UDS positive for cocaine on 06/16/12  Allergies:  Allergies  Allergen Reactions  . Naproxen Hives  . Motrin (Ibuprofen) Rash and Other (See Comments)    Redness and bumps on face    Prescriptions prior to admission  Medication Sig Dispense Refill  . acetaminophen-codeine (TYLENOL #3) 300-30 MG per tablet Take 1 tablet by mouth every 6 (six) hours as needed for pain. Disp #20 NR  30 tablet  0  . cyclobenzaprine (FLEXERIL) 10 MG tablet Take 10 mg by mouth 3 (three) times daily as needed for muscle spasms.      . ondansetron (ZOFRAN) 8 MG tablet Take 8 mg by mouth every 8 (eight) hours as needed for nausea.      .  Prenatal Multivit-Min-Fe-FA (PRENATAL VITAMINS) 0.8 MG tablet Take 1 tablet by mouth daily.  30 tablet  12    Review of Systems  Constitutional: Negative for fever and chills.  Eyes: Negative for blurred vision and double vision.  Respiratory: Negative for shortness of breath.   Cardiovascular: Negative for chest pain.  Gastrointestinal: Positive for abdominal pain. Negative for nausea, vomiting, diarrhea and constipation.  Genitourinary: Negative for dysuria.  Musculoskeletal: Positive for back pain.  Neurological: Negative for dizziness and headaches.   Physical Exam   Blood pressure 124/78, pulse 83, temperature 98.3 F (36.8 C), temperature source Oral, resp. rate 18, last menstrual period 11/13/2011.  Physical Exam  Constitutional: She is oriented to person, place, and time. She appears well-developed and well-nourished. She appears distressed.  HENT:  Head: Normocephalic and atraumatic.  Eyes: Conjunctivae and EOM are normal.  Neck: Normal range of motion. Neck supple.  Cardiovascular: Normal rate.   Respiratory: Effort normal. No respiratory distress.  GI: Soft. There is no tenderness. There is no rebound and no guarding.  Genitourinary:  Normal external genitalia. Normal  vagina. No CMT. Cervix 3.5/50/-2.  Musculoskeletal: Normal range of motion. She exhibits no edema and no tenderness.  Neurological: She is alert and oriented to person, place, and time.  Skin: Skin is warm and dry.  Psychiatric: She has a normal mood and affect.   Dilation: 3.5 Effacement (%): 50 Cervical Position: Middle Station: -2 Presentation: Vertex Exam by:: Dr. Thad Ranger  Results for orders placed during the hospital encounter of 08/01/12 (from the past 24 hour(s))  URINALYSIS, ROUTINE W REFLEX MICROSCOPIC     Status: Abnormal   Collection Time    08/01/12  5:50 PM      Result Value Range   Color, Urine YELLOW  YELLOW   APPearance HAZY (*) CLEAR   Specific Gravity, Urine 1.015  1.005 - 1.030    pH 6.5  5.0 - 8.0   Glucose, UA NEGATIVE  NEGATIVE mg/dL   Hgb urine dipstick NEGATIVE  NEGATIVE   Bilirubin Urine NEGATIVE  NEGATIVE   Ketones, ur 40 (*) NEGATIVE mg/dL   Protein, ur NEGATIVE  NEGATIVE mg/dL   Urobilinogen, UA 1.0  0.0 - 1.0 mg/dL   Nitrite POSITIVE (*) NEGATIVE   Leukocytes, UA SMALL (*) NEGATIVE  URINE MICROSCOPIC-ADD ON     Status: Abnormal   Collection Time    08/01/12  5:50 PM      Result Value Range   Squamous Epithelial / LPF FEW (*) RARE   WBC, UA 11-20  <3 WBC/hpf   Bacteria, UA MANY (*) RARE     MAU Course  Procedures  FHTs:  140, moderate variability, accels present, occasional variable TOCO:  q 6 min, then spacing out.  Assessment and Plan  22 y.o. G3P2002 at [redacted]w[redacted]d with contractions - No cervical change after 3 hours. Ambien for sleep - UTI - will treat with keflex - Back pain - has tylenol #3 at home, flexeril reordered. - Labor precautions discussed - Needs to schedule clinic appt, has repeat c-section already scheduled but also needs preop visit. Considering VBAC still but prefers repeat c-section.  Napoleon Form,  Napoleon Form 08/01/2012, 5:09 PM

## 2012-08-01 NOTE — MAU Note (Signed)
Contractions since 11:30 denies vaginal bleeding or LOF, was 4 cm on Wednesday.

## 2012-08-01 NOTE — MAU Note (Signed)
Patient on left side offered her something to drink refused.

## 2012-08-03 LAB — URINE CULTURE: Colony Count: >=100000

## 2012-08-04 NOTE — MAU Provider Note (Signed)
Attestation of Attending Supervision of Advanced Practitioner (CNM/NP): Evaluation and management procedures were performed by the Advanced Practitioner under my supervision and collaboration.  I have reviewed the Advanced Practitioner's note and chart, and I agree with the management and plan.  HARRAWAY-SMITH, Derryck Shahan 5:53 PM     

## 2012-08-06 ENCOUNTER — Other Ambulatory Visit: Payer: Self-pay | Admitting: Obstetrics and Gynecology

## 2012-08-06 ENCOUNTER — Encounter: Payer: Self-pay | Admitting: Advanced Practice Midwife

## 2012-08-06 ENCOUNTER — Ambulatory Visit (INDEPENDENT_AMBULATORY_CARE_PROVIDER_SITE_OTHER): Payer: Self-pay | Admitting: Advanced Practice Midwife

## 2012-08-06 VITALS — BP 116/77 | Temp 97.0°F | Wt 148.3 lb

## 2012-08-06 DIAGNOSIS — O0992 Supervision of high risk pregnancy, unspecified, second trimester: Secondary | ICD-10-CM

## 2012-08-06 DIAGNOSIS — O99340 Other mental disorders complicating pregnancy, unspecified trimester: Secondary | ICD-10-CM

## 2012-08-06 DIAGNOSIS — O9989 Other specified diseases and conditions complicating pregnancy, childbirth and the puerperium: Secondary | ICD-10-CM

## 2012-08-06 DIAGNOSIS — O34219 Maternal care for unspecified type scar from previous cesarean delivery: Secondary | ICD-10-CM

## 2012-08-06 LAB — POCT URINALYSIS DIP (DEVICE)
Glucose, UA: NEGATIVE mg/dL
Hgb urine dipstick: NEGATIVE
Nitrite: NEGATIVE
Protein, ur: NEGATIVE mg/dL
Urobilinogen, UA: 0.2 mg/dL (ref 0.0–1.0)
pH: 7 (ref 5.0–8.0)

## 2012-08-06 MED ORDER — ACETAMINOPHEN-CODEINE #3 300-30 MG PO TABS: 1.0000 | ORAL_TABLET | Freq: Four times a day (QID) | ORAL | Status: DC | PRN

## 2012-08-06 NOTE — Progress Notes (Signed)
Reports 2 contractions every hour, low back pain, labor eval on 4/11 with no cervical change. No change again today. Rev'd labor precautions. F/U for scheduled repeat section on 4/22 or prior to that with signs of labor or other concerns.

## 2012-08-06 NOTE — Progress Notes (Signed)
Pulse- 94 Patient reports a lot of vaginal pain/pressure and a lot of pain in lower back as well as some contractions every hour. Also reports pinkish mucous d/c

## 2012-08-06 NOTE — Patient Instructions (Addendum)
Pregnancy - Third Trimester The third trimester begins at the 28th week of pregnancy and ends at birth. It is important to follow your doctor's instructions. HOME CARE   Go to your doctor's visits.  Do not smoke.  Do not drink alcohol or use drugs.  Only take medicine as told by your doctor.  Take prenatal vitamins as told. The vitamin should contain 1 milligram of folic acid.  Exercise.  Eat healthy foods. Eat regular, well-balanced meals.  You can have sex (intercourse) if there are no other problems with the pregnancy.  Do not use hot tubs, steam rooms, or saunas.  Wear a seat belt while driving.  Avoid raw meat, uncooked cheese, and litter boxes and soil used by cats.  Rest with your legs raised (elevated).  Make a list of emergency phone numbers. Keep this list with you.  Arrange for help when you come back home after delivering the baby.  Make a trial run to the hospital.  Take prenatal classes.  Prepare the baby's nursery.  Do not travel out of the city. If you absolutely have to, get permission from your doctor first.  Wear flat shoes. Do not wear high heels. GET HELP RIGHT AWAY IF:   You have a temperature by mouth above 102 F (38.9 C), not controlled by medicine.  You have not felt the baby move for more than 1 hour. If you think the baby is not moving as much as normal, eat something with sugar in it or lie down on your left side for an hour. The baby should move at least 4 to 5 times per hour.  Fluid is coming from the vagina.  Blood is coming from the vagina. Light spotting is common, especially after sex (intercourse).  You have belly (abdominal) pain.  You have a bad smelling fluid (discharge) coming from the vagina. The fluid changes from clear to white.  You still feel sick to your stomach (nauseous).  You throw up (vomit) for more than 24 hours.  You have the chills.  You have shortness of breath.  You have a burning feeling when you  pee (urinate).  You lose or gain more than 2 pounds (0.9 kilograms) of weight over a week, or as told by your doctor.  Your face, hands, feet, or legs get puffy (swell).  You have a bad headache that will not go away.  You start to have problems seeing (blurry or double vision).  You fall, are in a car accident, or have any kind of trauma.  There is mental or physical violence at home.  You have any concerns or worries during your pregnancy. MAKE SURE YOU:   Understand these instructions.  Will watch your condition.  Will get help right away if you are not doing well or get worse. Document Released: 07/04/2009 Document Revised: 07/02/2011 Document Reviewed: 07/04/2009 ExitCare Patient Information 2013 [employer].  

## 2012-08-11 ENCOUNTER — Encounter (HOSPITAL_COMMUNITY)
Admission: RE | Admit: 2012-08-11 | Discharge: 2012-08-11 | Disposition: A | Discharge status: Home / Self Care | Payer: Medicaid Other | Source: Ambulatory Visit | Attending: Obstetrics & Gynecology | Admitting: Obstetrics & Gynecology

## 2012-08-11 ENCOUNTER — Encounter (HOSPITAL_COMMUNITY): Payer: Self-pay

## 2012-08-11 VITALS — BP 112/73 | HR 91 | Temp 97.3°F | Resp 20 | Ht 62.0 in | Wt 146.0 lb

## 2012-08-11 DIAGNOSIS — Z302 Encounter for sterilization: Secondary | ICD-10-CM

## 2012-08-11 DIAGNOSIS — O34219 Maternal care for unspecified type scar from previous cesarean delivery: Secondary | ICD-10-CM

## 2012-08-11 HISTORY — DX: Gastro-esophageal reflux disease without esophagitis: K21.9

## 2012-08-11 LAB — CBC
MCH: 27.7 pg (ref 26.0–34.0)
MCHC: 32.8 g/dL (ref 30.0–36.0)
MCV: 84.4 fL (ref 78.0–100.0)
Platelets: 176 10*3/uL (ref 150–400)
RDW: 14.1 % (ref 11.5–15.5)

## 2012-08-11 LAB — RPR: RPR Ser Ql: NONREACTIVE

## 2012-08-11 NOTE — Patient Instructions (Addendum)
19 Debra Jensen  08/11/2012   Your procedure is scheduled on:  08/12/12  Enter through the Main Entrance of [facility] at 8 AM.  Pick up the phone at the desk and dial [phone].   Call this number if you have problems the morning of surgery: [phone]   Remember:   Do not eat food:After Midnight.  Do not drink clear liquids: After Midnight.  Take these medicines the morning of surgery with A SIP OF WATER: Zantac   Do not wear jewelry, make-up or nail polish.  Do not wear lotions, powders, or perfumes. You may wear deodorant.  Do not shave 48 hours prior to surgery.  Do not bring valuables to the hospital.  Contacts, dentures or bridgework may not be worn into surgery.  Leave suitcase in the car. After surgery it may be brought to your room.  For patients admitted to the hospital, checkout time is 11:00 AM the day of discharge.   Patients discharged the day of surgery will not be allowed to drive home.  Name and phone number of your driver: NA  Special Instructions: Shower using CHG 2 nights before surgery and the night before surgery.  If you shower the day of surgery use CHG.  Use special wash - you have one bottle of CHG for all showers.  You should use approximately 1/3 of the bottle for each shower.   Please read over the following fact sheets that you were given: Surgical Site Infection Prevention

## 2012-08-12 ENCOUNTER — Inpatient Hospital Stay (HOSPITAL_COMMUNITY)
Admission: RE | Admit: 2012-08-12 | Discharge: 2012-08-14 | DRG: 766 | Disposition: A | Discharge status: Home / Self Care | Payer: Medicaid Other | Source: Ambulatory Visit | Attending: Obstetrics & Gynecology | Admitting: Obstetrics & Gynecology

## 2012-08-12 ENCOUNTER — Encounter (HOSPITAL_COMMUNITY): Payer: Self-pay | Admitting: Anesthesiology

## 2012-08-12 ENCOUNTER — Encounter (HOSPITAL_COMMUNITY)
Admission: RE | Disposition: A | Discharge status: Home / Self Care | Payer: Self-pay | Source: Ambulatory Visit | Attending: Obstetrics & Gynecology

## 2012-08-12 ENCOUNTER — Inpatient Hospital Stay (HOSPITAL_COMMUNITY): Payer: Medicaid Other | Admitting: Anesthesiology

## 2012-08-12 DIAGNOSIS — F1410 Cocaine abuse, uncomplicated: Secondary | ICD-10-CM | POA: Diagnosis present

## 2012-08-12 DIAGNOSIS — O99344 Other mental disorders complicating childbirth: Secondary | ICD-10-CM | POA: Diagnosis present

## 2012-08-12 DIAGNOSIS — Z302 Encounter for sterilization: Secondary | ICD-10-CM

## 2012-08-12 DIAGNOSIS — O34219 Maternal care for unspecified type scar from previous cesarean delivery: Secondary | ICD-10-CM

## 2012-08-12 DIAGNOSIS — Z98891 History of uterine scar from previous surgery: Secondary | ICD-10-CM

## 2012-08-12 DIAGNOSIS — Z01812 Encounter for preprocedural laboratory examination: Secondary | ICD-10-CM

## 2012-08-12 HISTORY — PX: TUBAL LIGATION: SHX77

## 2012-08-12 LAB — RAPID URINE DRUG SCREEN, HOSP PERFORMED
Barbiturates: NOT DETECTED
Cocaine: NOT DETECTED
Tetrahydrocannabinol: NOT DETECTED

## 2012-08-12 SURGERY — Surgical Case
Anesthesia: Spinal | Site: Abdomen | Wound class: Clean Contaminated

## 2012-08-12 MED ORDER — WITCH HAZEL-GLYCERIN EX PADS: 1.0000 "application " | MEDICATED_PAD | CUTANEOUS | Status: DC | PRN

## 2012-08-12 MED ORDER — DIPHENHYDRAMINE HCL 50 MG/ML IJ SOLN: 25.0000 mg | INTRAMUSCULAR | Status: DC | PRN

## 2012-08-12 MED ORDER — ACETAMINOPHEN 10 MG/ML IV SOLN
INTRAVENOUS | Status: AC
  Filled 2012-08-12: qty 100

## 2012-08-12 MED ORDER — LACTATED RINGERS IV SOLN: INTRAVENOUS | Status: DC | PRN

## 2012-08-12 MED ORDER — SIMETHICONE 80 MG PO CHEW
80.0000 mg | CHEWABLE_TABLET | ORAL | Status: DC | PRN
  Administered 2012-08-13 – 2012-08-14 (×2): 80 mg via ORAL

## 2012-08-12 MED ORDER — TETANUS-DIPHTH-ACELL PERTUSSIS 5-2.5-18.5 LF-MCG/0.5 IM SUSP: 0.5000 mL | Freq: Once | INTRAMUSCULAR | Status: DC

## 2012-08-12 MED ORDER — ONDANSETRON HCL 4 MG PO TABS: 4.0000 mg | ORAL_TABLET | ORAL | Status: DC | PRN

## 2012-08-12 MED ORDER — CEFAZOLIN SODIUM-DEXTROSE 2-3 GM-% IV SOLR
INTRAVENOUS | Status: AC
  Filled 2012-08-12: qty 50

## 2012-08-12 MED ORDER — LACTATED RINGERS IV SOLN: INTRAVENOUS | Status: DC

## 2012-08-12 MED ORDER — ONDANSETRON HCL 4 MG/2ML IJ SOLN
INTRAMUSCULAR | Status: DC | PRN
  Administered 2012-08-12: 4 mg via INTRAVENOUS

## 2012-08-12 MED ORDER — BUPIVACAINE IN DEXTROSE 0.75-8.25 % IT SOLN
INTRATHECAL | Status: DC | PRN
  Administered 2012-08-12: 1.4 mL via INTRATHECAL

## 2012-08-12 MED ORDER — MEPERIDINE HCL 25 MG/ML IJ SOLN
INTRAMUSCULAR | Status: AC
  Filled 2012-08-12: qty 1

## 2012-08-12 MED ORDER — SCOPOLAMINE 1 MG/3DAYS TD PT72: 1.0000 | MEDICATED_PATCH | Freq: Once | TRANSDERMAL | Status: DC

## 2012-08-12 MED ORDER — MEPERIDINE HCL 25 MG/ML IJ SOLN: 6.2500 mg | INTRAMUSCULAR | Status: DC | PRN

## 2012-08-12 MED ORDER — FENTANYL CITRATE 0.05 MG/ML IJ SOLN
INTRAMUSCULAR | Status: AC
  Administered 2012-08-12: 50 ug via INTRAVENOUS
  Filled 2012-08-12: qty 2

## 2012-08-12 MED ORDER — NALOXONE HCL 0.4 MG/ML IJ SOLN: 0.4000 mg | INTRAMUSCULAR | Status: DC | PRN

## 2012-08-12 MED ORDER — FENTANYL CITRATE 0.05 MG/ML IJ SOLN
INTRAMUSCULAR | Status: DC | PRN
  Administered 2012-08-12: 15 ug via INTRATHECAL

## 2012-08-12 MED ORDER — ONDANSETRON HCL 4 MG/2ML IJ SOLN: 4.0000 mg | Freq: Four times a day (QID) | INTRAMUSCULAR | Status: DC | PRN

## 2012-08-12 MED ORDER — FENTANYL CITRATE 0.05 MG/ML IJ SOLN
INTRAMUSCULAR | Status: AC
  Filled 2012-08-12: qty 2

## 2012-08-12 MED ORDER — ACETAMINOPHEN 10 MG/ML IV SOLN
1000.0000 mg | Freq: Once | INTRAVENOUS | Status: DC | PRN
  Administered 2012-08-12: 1000 mg via INTRAVENOUS

## 2012-08-12 MED ORDER — CEFAZOLIN SODIUM-DEXTROSE 2-3 GM-% IV SOLR
2.0000 g | INTRAVENOUS | Status: AC
  Administered 2012-08-12: 2 g via INTRAVENOUS

## 2012-08-12 MED ORDER — HEMOSTATIC AGENTS (NO CHARGE) OPTIME
TOPICAL | Status: DC | PRN
  Administered 2012-08-12: 1 via TOPICAL

## 2012-08-12 MED ORDER — FENTANYL CITRATE 0.05 MG/ML IJ SOLN
INTRAMUSCULAR | Status: DC | PRN
  Administered 2012-08-12: 85 ug via INTRAVENOUS

## 2012-08-12 MED ORDER — DIPHENHYDRAMINE HCL 25 MG PO CAPS: 25.0000 mg | ORAL_CAPSULE | Freq: Four times a day (QID) | ORAL | Status: DC | PRN

## 2012-08-12 MED ORDER — ZOLPIDEM TARTRATE 5 MG PO TABS: 5.0000 mg | ORAL_TABLET | Freq: Every evening | ORAL | Status: DC | PRN

## 2012-08-12 MED ORDER — DIPHENHYDRAMINE HCL 50 MG/ML IJ SOLN: 12.5000 mg | INTRAMUSCULAR | Status: DC | PRN

## 2012-08-12 MED ORDER — ACETAMINOPHEN 10 MG/ML IV SOLN
1000.0000 mg | Freq: Four times a day (QID) | INTRAVENOUS | Status: AC
  Filled 2012-08-12 (×3): qty 100

## 2012-08-12 MED ORDER — MAGNESIUM HYDROXIDE 400 MG/5ML PO SUSP
30.0000 mL | ORAL | Status: DC | PRN
  Administered 2012-08-13: 30 mL via ORAL
  Filled 2012-08-12: qty 30

## 2012-08-12 MED ORDER — LANOLIN HYDROUS EX OINT: 1.0000 "application " | TOPICAL_OINTMENT | CUTANEOUS | Status: DC | PRN

## 2012-08-12 MED ORDER — PHENYLEPHRINE 40 MCG/ML (10ML) SYRINGE FOR IV PUSH (FOR BLOOD PRESSURE SUPPORT)
PREFILLED_SYRINGE | INTRAVENOUS | Status: AC
  Filled 2012-08-12: qty 5

## 2012-08-12 MED ORDER — SCOPOLAMINE 1 MG/3DAYS TD PT72
MEDICATED_PATCH | TRANSDERMAL | Status: AC
  Administered 2012-08-12: 1.5 mg via TRANSDERMAL
  Filled 2012-08-12: qty 1

## 2012-08-12 MED ORDER — OXYCODONE-ACETAMINOPHEN 5-325 MG PO TABS
1.0000 | ORAL_TABLET | ORAL | Status: DC | PRN
  Administered 2012-08-12 – 2012-08-14 (×10): 2 via ORAL
  Filled 2012-08-12 (×10): qty 2

## 2012-08-12 MED ORDER — CYCLOBENZAPRINE HCL 10 MG PO TABS
10.0000 mg | ORAL_TABLET | Freq: Three times a day (TID) | ORAL | Status: DC | PRN
  Administered 2012-08-13 (×2): 10 mg via ORAL
  Filled 2012-08-12 (×2): qty 1

## 2012-08-12 MED ORDER — PRENATAL MULTIVITAMIN CH
1.0000 | ORAL_TABLET | Freq: Every day | ORAL | Status: DC
  Administered 2012-08-13: 1 via ORAL
  Filled 2012-08-12: qty 1

## 2012-08-12 MED ORDER — SENNOSIDES-DOCUSATE SODIUM 8.6-50 MG PO TABS
2.0000 | ORAL_TABLET | Freq: Every day | ORAL | Status: DC
  Administered 2012-08-13: 2 via ORAL

## 2012-08-12 MED ORDER — NICOTINE 7 MG/24HR TD PT24
7.0000 mg | MEDICATED_PATCH | Freq: Every day | TRANSDERMAL | Status: DC
  Administered 2012-08-13 – 2012-08-14 (×2): 7 mg via TRANSDERMAL
  Filled 2012-08-12 (×3): qty 1

## 2012-08-12 MED ORDER — ONDANSETRON HCL 4 MG/2ML IJ SOLN: 4.0000 mg | INTRAMUSCULAR | Status: DC | PRN

## 2012-08-12 MED ORDER — OXYTOCIN 10 UNIT/ML IJ SOLN
INTRAMUSCULAR | Status: AC
  Filled 2012-08-12: qty 4

## 2012-08-12 MED ORDER — FENTANYL CITRATE 0.05 MG/ML IJ SOLN
25.0000 ug | INTRAMUSCULAR | Status: DC | PRN
  Administered 2012-08-12 (×2): 50 ug via INTRAVENOUS

## 2012-08-12 MED ORDER — SODIUM CHLORIDE 0.9 % IJ SOLN: 3.0000 mL | INTRAMUSCULAR | Status: DC | PRN

## 2012-08-12 MED ORDER — BUPIVACAINE HCL (PF) 0.5 % IJ SOLN
INTRAMUSCULAR | Status: DC | PRN
  Administered 2012-08-12: 30 mL

## 2012-08-12 MED ORDER — OXYTOCIN 10 UNIT/ML IJ SOLN
40.0000 [IU] | INTRAVENOUS | Status: DC | PRN
  Administered 2012-08-12: 40 [IU] via INTRAVENOUS

## 2012-08-12 MED ORDER — BUPIVACAINE HCL (PF) 0.5 % IJ SOLN
INTRAMUSCULAR | Status: AC
  Filled 2012-08-12: qty 30

## 2012-08-12 MED ORDER — NALBUPHINE HCL 10 MG/ML IJ SOLN
5.0000 mg | INTRAMUSCULAR | Status: DC | PRN
  Filled 2012-08-12 (×2): qty 1

## 2012-08-12 MED ORDER — MORPHINE SULFATE 0.5 MG/ML IJ SOLN
INTRAMUSCULAR | Status: AC
  Filled 2012-08-12: qty 10

## 2012-08-12 MED ORDER — NALOXONE HCL 1 MG/ML IJ SOLN
1.0000 ug/kg/h | INTRAVENOUS | Status: DC | PRN
  Filled 2012-08-12: qty 2

## 2012-08-12 MED ORDER — OXYTOCIN 40 UNITS IN LACTATED RINGERS INFUSION - SIMPLE MED: 125.0000 mL/h | INTRAVENOUS | Status: AC

## 2012-08-12 MED ORDER — MORPHINE SULFATE (PF) 0.5 MG/ML IJ SOLN
INTRAMUSCULAR | Status: DC | PRN
  Administered 2012-08-12: .1 mg via INTRATHECAL

## 2012-08-12 MED ORDER — PHENYLEPHRINE HCL 10 MG/ML IJ SOLN
INTRAMUSCULAR | Status: DC | PRN
  Administered 2012-08-12 (×2): 40 ug via INTRAVENOUS

## 2012-08-12 MED ORDER — METOCLOPRAMIDE HCL 5 MG/ML IJ SOLN: 10.0000 mg | Freq: Three times a day (TID) | INTRAMUSCULAR | Status: DC | PRN

## 2012-08-12 MED ORDER — HYDROMORPHONE 0.3 MG/ML IV SOLN
INTRAVENOUS | Status: DC
  Administered 2012-08-12: 4.6 mg via INTRAVENOUS
  Administered 2012-08-12: 2.59 mg via INTRAVENOUS
  Administered 2012-08-12: 15:00:00 via INTRAVENOUS
  Filled 2012-08-12: qty 25

## 2012-08-12 MED ORDER — EPHEDRINE 5 MG/ML INJ
INTRAVENOUS | Status: AC
  Filled 2012-08-12: qty 10

## 2012-08-12 MED ORDER — ONDANSETRON HCL 4 MG/2ML IJ SOLN: 4.0000 mg | Freq: Three times a day (TID) | INTRAMUSCULAR | Status: DC | PRN

## 2012-08-12 MED ORDER — 0.9 % SODIUM CHLORIDE (POUR BTL) OPTIME
TOPICAL | Status: DC | PRN
  Administered 2012-08-12: 1000 mL

## 2012-08-12 MED ORDER — DIBUCAINE 1 % RE OINT: 1.0000 "application " | TOPICAL_OINTMENT | RECTAL | Status: DC | PRN

## 2012-08-12 MED ORDER — CHLOROPROCAINE HCL 3 % IJ SOLN
INTRAMUSCULAR | Status: AC
  Filled 2012-08-12: qty 20

## 2012-08-12 MED ORDER — SODIUM CHLORIDE 0.9 % IJ SOLN: 9.0000 mL | INTRAMUSCULAR | Status: DC | PRN

## 2012-08-12 MED ORDER — MEPERIDINE HCL 25 MG/ML IJ SOLN
INTRAMUSCULAR | Status: DC | PRN
  Administered 2012-08-12 (×2): 12.5 mg via INTRAVENOUS

## 2012-08-12 MED ORDER — DIPHENHYDRAMINE HCL 25 MG PO CAPS
25.0000 mg | ORAL_CAPSULE | ORAL | Status: DC | PRN
  Administered 2012-08-13 (×3): 25 mg via ORAL
  Filled 2012-08-12 (×4): qty 1

## 2012-08-12 MED ORDER — MENTHOL 3 MG MT LOZG: 1.0000 | LOZENGE | OROMUCOSAL | Status: DC | PRN

## 2012-08-12 MED ORDER — NALBUPHINE HCL 10 MG/ML IJ SOLN
5.0000 mg | INTRAMUSCULAR | Status: DC | PRN
  Administered 2012-08-12: 5 mg via INTRAVENOUS
  Filled 2012-08-12: qty 1

## 2012-08-12 MED ORDER — EPHEDRINE SULFATE 50 MG/ML IJ SOLN
INTRAMUSCULAR | Status: DC | PRN
  Administered 2012-08-12 (×2): 10 mg via INTRAVENOUS

## 2012-08-12 MED ORDER — DIPHENHYDRAMINE HCL 50 MG/ML IJ SOLN: 12.5000 mg | Freq: Four times a day (QID) | INTRAMUSCULAR | Status: DC | PRN

## 2012-08-12 MED ORDER — ONDANSETRON HCL 4 MG/2ML IJ SOLN
INTRAMUSCULAR | Status: AC
  Filled 2012-08-12: qty 2

## 2012-08-12 MED ORDER — LACTATED RINGERS IV SOLN
INTRAVENOUS | Status: DC
  Administered 2012-08-12 (×4): via INTRAVENOUS

## 2012-08-12 MED ORDER — DIPHENHYDRAMINE HCL 12.5 MG/5ML PO ELIX
12.5000 mg | ORAL_SOLUTION | Freq: Four times a day (QID) | ORAL | Status: DC | PRN
  Filled 2012-08-12: qty 5

## 2012-08-12 SURGICAL SUPPLY — 35 items
CLIP FILSHIE TUBAL LIGA STRL (Clip) ×4 IMPLANT
CLOTH BEACON ORANGE TIMEOUT ST (SAFETY) ×4 IMPLANT
DRAPE LG THREE QUARTER DISP (DRAPES) ×4 IMPLANT
DRSG OPSITE POSTOP 4X10 (GAUZE/BANDAGES/DRESSINGS) ×4 IMPLANT
DURAPREP 26ML APPLICATOR (WOUND CARE) ×4 IMPLANT
ELECT REM PT RETURN 9FT ADLT (ELECTROSURGICAL) ×4
ELECTRODE REM PT RTRN 9FT ADLT (ELECTROSURGICAL) ×3 IMPLANT
EXTRACTOR VACUUM M CUP 4 TUBE (SUCTIONS) IMPLANT
GLOVE BIO SURGEON STRL SZ7 (GLOVE) ×4 IMPLANT
GLOVE BIOGEL PI IND STRL 7.0 (GLOVE) ×12 IMPLANT
GLOVE BIOGEL PI INDICATOR 7.0 (GLOVE) ×4
GLOVE ECLIPSE 6.5 STRL STRAW (GLOVE) ×4 IMPLANT
GLOVE SURG SS PI 7.0 STRL IVOR (GLOVE) ×4 IMPLANT
GOWN STRL REIN XL XLG (GOWN DISPOSABLE) ×8 IMPLANT
HEMOSTAT SURGICEL 2X14 (HEMOSTASIS) ×4 IMPLANT
KIT ABG SYR 3ML LUER SLIP (SYRINGE) IMPLANT
NEEDLE HYPO 22GX1.5 SAFETY (NEEDLE) ×4 IMPLANT
NEEDLE HYPO 25X5/8 SAFETYGLIDE (NEEDLE) ×4 IMPLANT
NS IRRIG 1000ML POUR BTL (IV SOLUTION) ×4 IMPLANT
PACK C SECTION WH (CUSTOM PROCEDURE TRAY) ×4 IMPLANT
PAD OB MATERNITY 4.3X12.25 (PERSONAL CARE ITEMS) ×4 IMPLANT
RTRCTR C-SECT PINK 25CM LRG (MISCELLANEOUS) IMPLANT
SLEEVE SCD COMPRESS KNEE LRG (MISCELLANEOUS) IMPLANT
SLEEVE SCD COMPRESS KNEE MED (MISCELLANEOUS) IMPLANT
SPONGE LAP 18X18 X RAY DECT (DISPOSABLE) ×4 IMPLANT
STAPLER VISISTAT 35W (STAPLE) IMPLANT
SUT PDS AB 0 CTX 36 PDP370T (SUTURE) IMPLANT
SUT VIC AB 0 CT1 36 (SUTURE) ×8 IMPLANT
SUT VIC AB 0 CTX 36 (SUTURE) ×3
SUT VIC AB 0 CTX36XBRD ANBCTRL (SUTURE) ×9 IMPLANT
SUT VIC AB 4-0 KS 27 (SUTURE) ×4 IMPLANT
SYR 30ML LL (SYRINGE) ×4 IMPLANT
TOWEL OR 17X24 6PK STRL BLUE (TOWEL DISPOSABLE) ×12 IMPLANT
TRAY FOLEY CATH 14FR (SET/KITS/TRAYS/PACK) ×4 IMPLANT
WATER STERILE IRR 1000ML POUR (IV SOLUTION) ×4 IMPLANT

## 2012-08-12 NOTE — Anesthesia Procedure Notes (Signed)
Spinal  Patient location during procedure: OR Start time: 08/12/2012 9:31 AM Staffing Performed by: anesthesiologist  Preanesthetic Checklist Completed: patient identified, site marked, surgical consent, pre-op evaluation, timeout performed, IV checked, risks and benefits discussed and monitors and equipment checked Spinal Block Patient position: sitting Prep: site prepped and draped and DuraPrep Patient monitoring: heart rate, cardiac monitor, continuous pulse ox and blood pressure Approach: midline Location: L3-4 Injection technique: single-shot Needle Needle type: Sprotte  Needle gauge: 24 G Needle length: 9 cm Assessment Sensory level: T4 Additional Notes Clear free flow CSF on first attempt.  No paresthesia.  Patient tolerated procedure well with no apparent complications.  Jasmine December, MD

## 2012-08-12 NOTE — OR Nursing (Signed)
08/12/2012 @ 0934 Urine collected via catheter in the operating room for UDS by Milana Kidney, RN, per Dr Macon Large.

## 2012-08-12 NOTE — H&P (Signed)
Debra Jensen is a 22 y.o. female presenting for elective repeat c-section and BTL. Denies contractions, bleeding, loss of fluid, abdominal pain.  Baby moving well.   Maternal Medical History:  Reason for admission: Nausea.  Fetal activity: Perceived fetal activity is normal.   Last perceived fetal movement was within the past hour.    Prenatal complications: no prenatal complications   OB History   Grav Para Term Preterm Abortions TAB SAB Ect Mult Living   3 2 2  0 0 0 0 0 0 2     Past Medical History  Diagnosis Date  . Scoliosis   . Back pain, chronic   . Anemia     with preg  . Urinary tract infection   . Chlamydia   . Reflux   . Headache   . GERD (gastroesophageal reflux disease)    Past Surgical History  Procedure Laterality Date  . Cesarean section    . Back surgery      harrington rods   Family History: family history includes Cancer in her mother and paternal uncle and Hypertension in her mother and other.  There is no history of Other. Social History:  reports that she has quit smoking. Her smoking use included Cigarettes. She has a 1 pack-year smoking history. She has never used smokeless tobacco. She reports that she does not drink alcohol or use illicit drugs.   Prenatal Transfer Tool  Maternal Diabetes: No Genetic Screening: Declined Maternal Ultrasounds/Referrals: Normal Fetal Ultrasounds or other Referrals:  None Maternal Substance Abuse:  Yes:  Type: Cocaine, Other: opiate use (prescribed) Significant Maternal Medications:  Meds include: Other: flexeril, tylenol #3 Significant Maternal Lab Results:  Lab values include: Group B Strep negative Other Comments:  None  Review of Systems  Constitutional: Negative for fever and chills.  Eyes: Negative for blurred vision and double vision.  Cardiovascular: Negative for chest pain.  Gastrointestinal: Negative for nausea, vomiting, abdominal pain, diarrhea and constipation.  Genitourinary: Negative for  dysuria.  Neurological: Negative for dizziness and headaches.      Blood pressure 117/69, pulse 92, temperature 98.2 F (36.8 C), temperature source Oral, resp. rate 20, last menstrual period 11/13/2011, SpO2 100.00%. Maternal Exam:  Abdomen: Surgical scars: low transverse.     Fetal Exam Fetal Monitor Review: Mode: hand-held doppler probe.   Baseline rate: 131.      Physical Exam  Constitutional: She is oriented to person, place, and time. She appears well-developed and well-nourished. No distress.  HENT:  Head: Normocephalic and atraumatic.  Eyes: Conjunctivae and EOM are normal.  Neck: Normal range of motion. Neck supple.  Cardiovascular: Normal rate, regular rhythm and normal heart sounds.   Respiratory: Effort normal and breath sounds normal. No respiratory distress.  GI: Soft. Bowel sounds are normal. There is no tenderness. There is no rebound and no guarding.  Musculoskeletal: Normal range of motion. She exhibits no edema and no tenderness.  Neurological: She is alert and oriented to person, place, and time.  Skin: Skin is warm and dry.  Psychiatric: She has a normal mood and affect.    Prenatal labs: ABO, Rh: --/--/A POS (04/21 1400) Antibody: NEG (04/21 1400) Rubella: 2.50 (12/16 1211) RPR: NON REACTIVE (04/21 1538)  HBsAg: NEGATIVE (12/16 1211)  HIV: NON REACTIVE (02/24 1725)  GBS:     Assessment/Plan: 22 y.o. G3P2002 at [redacted]w[redacted]d here for elective repeat c-section and BTL. No BTL papers on file. Patient states she thinks she signed them. Advised that she may receive  a bill for BTL not covered by medicaid. Pt states she would still like to proceed with BTL.  Orders are in - preop Ancef, SCDs, labs done. Hgb 10.5. Consent signed. To OR when ready.   Napoleon Form 08/12/2012, 8:37 AM

## 2012-08-12 NOTE — Progress Notes (Signed)
Patient has relief from itching, was given Nubain 5 mg IV at 2030. Called in room at 2110 with patient c/o pain at 10/10. I did assist her with ambulating in the room. Patient was tearful, did not want to stand up straight and complained that her incisional site was too tight and hurting. Told patient I would talk to on call MD/NP once they call back about order clarification. She was encouraged to use PCA and says she has temporary relief from it and that she has been pressing the button.

## 2012-08-12 NOTE — Anesthesia Preprocedure Evaluation (Addendum)
Anesthesia Evaluation  Patient identified by MRN, date of birth, ID band Patient awake    Reviewed: Allergy & Precautions, H&P , NPO status , Patient's Chart, lab work & pertinent test results, reviewed documented beta blocker date and time   History of Anesthesia Complications Negative for: history of anesthetic complications  Airway Mallampati: I TM Distance: >3 FB Neck ROM: full    Dental  (+) Poor Dentition   Pulmonary former smoker (quit with pregnancy),  breath sounds clear to auscultation  Pulmonary exam normal       Cardiovascular negative cardio ROS  Rhythm:regular Rate:Normal     Neuro/Psych  Headaches, Scolosis s/p rods, chronic back pain - on tylenol #3 daily for back pain negative psych ROS   GI/Hepatic GERD-  Medicated,(+)     substance abuse (patient reports last cocaine use was in February, reports no recent "illegal drug use")  cocaine use and marijuana use,   Endo/Other  negative endocrine ROS  Renal/GU negative Renal ROS  negative genitourinary   Musculoskeletal   Abdominal Normal abdominal exam  (+)   Peds  Hematology  (+) anemia ,   Anesthesia Other Findings   Reproductive/Obstetrics (+) Pregnancy (previous c/s, for repeat and BTL)                          Anesthesia Physical Anesthesia Plan  ASA: II  Anesthesia Plan: Spinal   Post-op Pain Management:    Induction:   Airway Management Planned:   Additional Equipment:   Intra-op Plan:   Post-operative Plan:   Informed Consent: I have reviewed the patients History and Physical, chart, labs and discussed the procedure including the risks, benefits and alternatives for the proposed anesthesia with the patient or authorized representative who has indicated his/her understanding and acceptance.     Plan Discussed with: Surgeon and CRNA  Anesthesia Plan Comments:         Anesthesia Quick Evaluation

## 2012-08-12 NOTE — H&P (Signed)
Attestation of Attending Supervision of Obstetric Fellow: Evaluation and management procedures were performed by the Obstetric Fellow under my supervision and collaboration.  I have reviewed the Obstetric Fellow's note and chart, and I agree with the management and plan.  The risks of cesarean section were discussed with the patient including but were not limited to: bleeding which may require transfusion or reoperation; infection which may require antibiotics; injury to bowel, bladder, ureters or other surrounding organs; injury to the fetus; need for additional procedures including hysterectomy in the event of a life-threatening hemorrhage; placental abnormalities wth subsequent pregnancies, incisional problems, thromboembolic phenomenon and other postoperative/anesthesia complications.  Patient also desires permanent sterilization.  Other reversible forms of contraception were discussed with patient; she declines all other modalities. Risks of procedure discussed with patient including but not limited to: risk of regret, permanence of method, bleeding, infection, injury to surrounding organs and need for additional procedures.  Failure risk of 1-2% with increased risk of ectopic gestation if pregnancy occurs was also discussed with patient.  The patient concurred with the proposed plan, giving informed written consent for the procedures.  Patient has been NPO since last night she will remain NPO for procedure. Anesthesia and OR aware.  Preoperative Ancef and SCDs ordered on call to the OR.  To OR when ready.   Jaynie Collins, MD, FACOG Attending Obstetrician & Gynecologist Faculty Practice, [facility] [place]

## 2012-08-12 NOTE — Anesthesia Postprocedure Evaluation (Signed)
  Anesthesia Post-op Note  Anesthesia Post Note  Patient: Debra Jensen  Procedure(s) Performed: Procedure(s) (LRB): CESAREAN SECTION (N/A) BILATERAL TUBAL LIGATION  Anesthesia type: Spinal  Patient location: PACU  Post pain: Pain level controlled  Post assessment: Post-op Vital signs reviewed  Last Vitals:  Filed Vitals:   08/12/12 1200  BP: 118/52  Pulse: 86  Temp: 36.3 C  Resp: 16    Post vital signs: Reviewed  Level of consciousness: awake  Complications: No apparent anesthesia complications

## 2012-08-12 NOTE — Transfer of Care (Signed)
Immediate Anesthesia Transfer of Care Note  Patient: Debra Jensen  Procedure(s) Performed: Procedure(s): CESAREAN SECTION (N/A) BILATERAL TUBAL LIGATION  Patient Location: PACU  Anesthesia Type:Spinal  Level of Consciousness: awake, alert , oriented and patient cooperative  Airway & Oxygen Therapy: Patient Spontanous Breathing  Post-op Assessment: Report given to PACU RN  Post vital signs: stable  Complications: No apparent anesthesia complications

## 2012-08-12 NOTE — Op Note (Signed)
Debra Jensen PROCEDURE DATE: 08/12/2012  PREOPERATIVE DIAGNOSIS: Intrauterine pregnancy at  [redacted]w[redacted]d weeks gestation; desires elective repeat cesarean section; undesired fertility  POSTOPERATIVE DIAGNOSIS: The same  PROCEDURE: Repeat Low Transverse Cesarean Section, Bilateral Tubal Sterilization using Filshie clips  SURGEON:  Dr. Jaynie Collins  ASSISTANT:  Dr. Napoleon Form  ANESTHESIOLOGIST: Dr. Dana Allan  INDICATIONS: Debra Jensen is a 22 y.o. [id number] at [redacted]w[redacted]d here for cesarean section and bilateral tubal sterilization secondary to the indications listed under preoperative diagnosis; please see preoperative note for further details.  The risks ofsurgery were discussed with the patient including but were not limited to: bleeding which may require transfusion or reoperation; infection which may require antibiotics; injury to bowel, bladder, ureters or other surrounding organs; injury to the fetus; need for additional procedures including hysterectomy in the event of a life-threatening hemorrhage; placental abnormalities wth subsequent pregnancies, incisional problems, thromboembolic phenomenon and other postoperative/anesthesia complications.  Patient also desires permanent sterilization.  Other reversible forms of contraception were discussed with patient; she declines all other modalities. Risks of procedure discussed with patient including but not limited to: risk of regret, permanence of method, bleeding, infection, injury to surrounding organs and need for additional procedures.  Failure risk of 0.5-1% with increased risk of ectopic gestation if pregnancy occurs was also discussed with patient.  The patient concurred with the proposed plan, giving informed written consent for the procedures.    FINDINGS:  Viable female infant in cephalic presentation.  Apgars 9 and 9.  Clear amniotic fluid.  Intact placenta, three vessel cord.  Normal uterus, fallopian tubes and ovaries bilaterally. Minimal  adhesive disease noted.  ANESTHESIA: Spinal INTRAVENOUS FLUIDS: 2600 ml ESTIMATED BLOOD LOSS: 800 ml URINE OUTPUT:  200 ml SPECIMENS: Placenta sent to L&D COMPLICATIONS: None immediate  PROCEDURE IN DETAIL:  The patient preoperatively received intravenous antibiotics and had sequential compression devices applied to her lower extremities.   She was then taken to the operating room where spinal anesthesia was administered and was found to be adequate. She was then placed in a dorsal supine position with a leftward tilt, and prepped and draped in a sterile manner.  A foley catheter was placed into her bladder and attached to constant gravity.  After an adequate timeout was performed, a Pfannenstiel skin incision was made with scalpel and carried through to the underlying layer of fascia. The fascia was incised in the midline, and this incision was extended bilaterally using the Mccombie scissors.  Kocher clamps were applied to the superior aspect of the fascial incision and the underlying rectus muscles were dissected off bluntly. A similar process was carried out on the inferior aspect of the fascial incision. The rectus muscles were separated in the midline bluntly and the peritoneum was entered bluntly. Attention was turned to the lower uterine segment where a low transverse hysterotomy was made with a scalpel and extended bilaterally bluntly.  The infant was successfully delivered, the cord was clamped and cut and the infant was handed over to awaiting neonatology team. Uterine massage was then administered, and the placenta delivered intact with a three-vessel cord. The uterus was then cleared of clot and debris.  The hysterotomy was closed with 0 Vicryl in a running locked fashion, and an imbricating layer was also placed with 0 Vicryl.  There was some bleeding involving a right sided uterine vessel; this was ligated using a figure of eight stitch.  Surgicel was placed over the hysterotomy.  Attention was  then turned to the  fallopian tubes.  A Filshie clip was placed on both tubes, about 3 cm from the cornua, with care given to incorporate the underlying mesosalpinx on both sides, allowing for bilateral tubal sterilization. The pelvis was cleared of all clot and debris. Hemostasis was confirmed on all surfaces.  The peritoneum and the muscles were reapproximated using 0 Vicryl interrupted stitches. The fascia was then closed using 0 Vicryl in a running fashion.  The subcutaneous layer was irrigated, and the skin was closed with a 4-0 Vicryl subcuticular stitch.  30 ml of 0.5% Marcaine was injected subcutaneously around the incision. The patient tolerated the procedure well. Sponge, lap, instrument and needle counts were correct x 2.  She was taken to the recovery room in stable condition.

## 2012-08-12 NOTE — Progress Notes (Signed)
UR completed 

## 2012-08-12 NOTE — Anesthesia Postprocedure Evaluation (Signed)
Anesthesia Post Note  Patient: Debra Jensen  Procedure(s) Performed: Procedure(s) (LRB): CESAREAN SECTION (N/A) BILATERAL TUBAL LIGATION  Anesthesia type: Spinal  Patient location: Mother/Baby  Post pain: Pain level controlled  Post assessment: Post-op Vital signs reviewed  Last Vitals:  Filed Vitals:   08/12/12 1333  BP: 120/75  Pulse: 62  Temp: 36.5 C  Resp: 18    Post vital signs: Reviewed  Level of consciousness: awake  Complications: No apparent anesthesia complications

## 2012-08-13 ENCOUNTER — Encounter (HOSPITAL_COMMUNITY): Payer: Self-pay | Admitting: Obstetrics & Gynecology

## 2012-08-13 ENCOUNTER — Encounter: Payer: Self-pay | Admitting: Obstetrics and Gynecology

## 2012-08-13 LAB — CBC
HCT: 24.4 % — ABNORMAL LOW (ref 36.0–46.0)
MCH: 27.9 pg (ref 26.0–34.0)
MCV: 84.1 fL (ref 78.0–100.0)
Platelets: 168 10*3/uL (ref 150–400)
RDW: 14 % (ref 11.5–15.5)

## 2012-08-13 MED ORDER — PNEUMOCOCCAL VAC POLYVALENT 25 MCG/0.5ML IJ INJ
0.5000 mL | INJECTION | INTRAMUSCULAR | Status: DC
  Filled 2012-08-13: qty 0.5

## 2012-08-13 NOTE — Progress Notes (Signed)
Have told patient at least 3 times since getting here at 1900 that she needs to get up and walk in the halls.  Pt is sitting up in the chair saying that her incision hurts and her back hurts.  Pt had 2 percocet at 1807 and a flexeril at 1956... I told the patient that she needed to get up and walk as soon as the flexeril kicks in but she has not wanted to get up still.  Will continue to monitor and encourage pt to get up and walk.   Winferd Humphrey, RN

## 2012-08-13 NOTE — Clinical Social Work Maternal (Signed)
    Clinical Social Work Department PSYCHOSOCIAL ASSESSMENT - MATERNAL/CHILD 08/13/2012  Patient:  Debra Jensen, Debra Jensen  Account Number:  [id number]  Admit Date:  08/12/2012  Marjo Bicker Name:   Debra Jensen    Clinical Social Worker:  Nobie Putnam, LCSW   Date/Time:  08/13/2012 12:51 PM  Date Referred:  08/13/2012   Referral source  CN     Referred reason  Substance Abuse   Other referral source:    I:  FAMILY / HOME ENVIRONMENT Child's legal guardian:  PARENT  Guardian - Name Guardian - Age Guardian - Address  Scottlynn Brossart [address] [place], [place] [address]  [place] 25    Other household support members/support persons Name Relationship DOB   SON 10/17/05   SON 07/19/10   Other support:    II  PSYCHOSOCIAL DATA Information Source:  Patient Interview  Event organiser Employment:   Financial resources:  Self Pay If Medicaid - [employer]:   Conservation officer, nature / Grade:   Maternity Care Coordinator / Child Services Coordination / Early Interventions:  Cultural issues impacting care:    III  STRENGTHS Strengths  Adequate Resources  Home prepared for Child (including basic supplies)  Supportive family/friends   Strength comment:    IV  RISK FACTORS AND CURRENT PROBLEMS Current Problem:  YES   Risk Factor & Current Problem Patient Issue Family Issue Risk Factor / Current Problem Comment  Substance Abuse Y N Hx of cocaine use    V  SOCIAL WORK ASSESSMENT CSW met with pt to assess her current social situation & frequency of substance use.  Pt received +UPT at 10 weeks. She denies any illegal substance use prior to pregnancy but admits to using cocaine during the pregnancy.  Pt told CSW that she used cocaine 2 times, with friends during the pregnancy.  She denies any regular use, as she stated " I can't afford it."  Pt tested + for cocaine, 2/14 & states that is the last time she used cocaine.  She denies other illegal  substance use.  CSW explained hospital drug testing policy & pt verbalized understanding.  UDS is negative, meconium results are pending.  She is concerned about CPS involvement & being "in trouble."  She has previous CPS involvement in 2012, as per pt.  Pt was prescribed Tylenol 3 to treat back pain during the last trimester.  She took the pills as prescribed, PRN.  Pt has all the necessary supplies for the infant & good family support.  FOB at the bedside & supportive.  CSW will continue to monitor drug screen results and make a report if needed.      VI SOCIAL WORK PLAN Social Work Plan  No Further Intervention Required / No Barriers to Discharge   Type of pt/family education:   If child protective services report - county:   If child protective services report - date:   Information/referral to community resources comment:   Other social work plan:

## 2012-08-13 NOTE — Progress Notes (Addendum)
Post Op Day 1  Subjective: up ad lib, voiding, tolerating PO, + flatus and complains of pain in abdomen  Objective: Blood pressure 124/79, pulse 86, temperature 98.6 F (37 C), temperature source Oral, resp. rate 18, height 5\' 2"  (1.575 m), weight 146 lb (66.225 kg), last menstrual period 11/13/2011, SpO2 98.00%, unknown if currently breastfeeding.  Physical Exam:  General: alert, cooperative and no distress Lochia: appropriate Uterine Fundus: unable to feel due to pt intolerance of exam from pain Incision: dressing clean, dry, intact DVT Evaluation: No evidence of DVT seen on physical exam. Negative Homan's sign. No cords or calf tenderness.   Recent Labs  08/11/12 1525 08/13/12 0605  HGB 10.5* 8.1*  HCT 32.0* 24.4*    Assessment/Plan: Plan for discharge tomorrow Social work consult for recent cocaine use  (+UDS 2/24) Bottle feeding Contraception: had BTL   LOS: 1 day   Debra Jensen 08/13/2012, 7:55 AM

## 2012-08-13 NOTE — Progress Notes (Signed)
I have seen and examined this patient and I agree with the above. Cam Hai 8:03 AM 08/13/2012

## 2012-08-14 MED ORDER — OXYCODONE-ACETAMINOPHEN 5-325 MG PO TABS: 1.0000 | ORAL_TABLET | ORAL | Status: DC | PRN

## 2012-08-14 NOTE — Discharge Summary (Signed)
Obstetric Discharge Summary Reason for Admission: cesarean section Prenatal Procedures: NST Intrapartum Procedures: cesarean: low cervical, transverse and tubal ligation Postpartum Procedures: none Complications-Operative and Postpartum: none  Hemoglobin  Date Value Range Status  08/13/2012 8.1* 12.0 - 15.0 g/dL Final     DELTA CHECK NOTED     REPEATED TO VERIFY     HCT  Date Value Range Status  08/13/2012 24.4* 36.0 - 46.0 % Final  Hospital Course: Debra Jensen is a 22 y.o. female presenting for elective repeat c-section and BTL. Denies contractions, bleeding, loss of fluid, abdominal pain. Baby moving well Debra Jensen  PROCEDURE DATE: 08/12/2012  PREOPERATIVE DIAGNOSIS: Intrauterine pregnancy at [redacted]w[redacted]d weeks gestation; desires elective repeat cesarean section; undesired fertility  POSTOPERATIVE DIAGNOSIS: The same  PROCEDURE: Repeat Low Transverse Cesarean Section, Bilateral Tubal Sterilization using Filshie clips  SURGEON: Dr. Jaynie Collins  ASSISTANT: Dr. Napoleon Form  ANESTHESIOLOGIST: Dr. Dana Allan   FINDINGS: Viable female infant in cephalic presentation. Apgars 9 and 9. Clear amniotic fluid. Intact placenta, three vessel cord. Normal uterus, fallopian tubes and ovaries bilaterally. Minimal adhesive disease noted. She has done well postop. She was seen by the Child psychotherapist and cleared for d/c. Tolerating PO intake well as well as activity.    Physical Exam:  General: alert, cooperative and no distress Lochia: appropriate Uterine Fundus: firm Incision: healing well, no significant drainage, no significant erythema DVT Evaluation: No evidence of DVT seen on physical exam.  Discharge Diagnoses: Term Pregnancy-delivered  Discharge Information: Date: 08/14/2012 Activity: unrestricted and pelvic rest Diet: routine Medications: PNV and Percocet Condition: stable and improved Instructions: refer to practice specific booklet Discharge to: home Follow-up Information   Follow up with [facility]. Schedule an appointment as soon as possible for a visit in 6 weeks.   Contact information:   [phone]      Newborn Data: Live born female  Birth Weight: 6 lb 8.2 oz (2954 g) APGAR: 9, 9  Home with mother.  [facility] 08/14/2012, 6:44 AM

## 2012-08-17 NOTE — Discharge Summary (Signed)
Attestation of Attending Supervision of Advanced Practitioner: Evaluation and management procedures were performed by the PA/NP/CNM/OB Fellow under my supervision/collaboration. Chart reviewed and agree with management and plan.  Evette Diclemente V 08/17/2012 9:02 AM

## 2012-09-04 ENCOUNTER — Inpatient Hospital Stay (HOSPITAL_COMMUNITY)
Admission: AD | Admit: 2012-09-04 | Discharge: 2012-09-04 | Disposition: A | Discharge status: Home / Self Care | Payer: Medicaid Other | Source: Ambulatory Visit | Attending: Obstetrics and Gynecology | Admitting: Obstetrics and Gynecology

## 2012-09-04 ENCOUNTER — Encounter (HOSPITAL_COMMUNITY): Payer: Self-pay | Admitting: *Deleted

## 2012-09-04 DIAGNOSIS — O26899 Other specified pregnancy related conditions, unspecified trimester: Secondary | ICD-10-CM

## 2012-09-04 DIAGNOSIS — R109 Unspecified abdominal pain: Secondary | ICD-10-CM | POA: Insufficient documentation

## 2012-09-04 DIAGNOSIS — O9089 Other complications of the puerperium, not elsewhere classified: Secondary | ICD-10-CM

## 2012-09-04 MED ORDER — TRAMADOL HCL 50 MG PO TABS: 50.0000 mg | ORAL_TABLET | Freq: Four times a day (QID) | ORAL | Status: DC | PRN

## 2012-09-04 NOTE — MAU Note (Signed)
Delivered via c-section on April 22nd; having moderate bleeding with medium -sized clots with severe cramping;

## 2012-09-04 NOTE — MAU Provider Note (Signed)
None     Chief Complaint:  Vaginal Bleeding and Abdominal Cramping   Debra Jensen is  22 y.o. [id number] who had a c/section 4.22.14.  She has been bleeding off and on since then, but started having increased cramping last night.  Passed a "chicken nugget" sized clot, but bleeding is scant now. Wants something for pain     Past Medical History  Diagnosis Date  . Scoliosis   . Back pain, chronic   . Anemia     with preg  . Urinary tract infection   . Chlamydia   . Reflux   . Headache   . GERD (gastroesophageal reflux disease)     Past Surgical History  Procedure Laterality Date  . Cesarean section    . Back surgery      harrington rods  . Cesarean section N/A 08/12/2012    Procedure: CESAREAN SECTION;  Surgeon: Tereso Newcomer, MD;  Location: [facility];  Service: Obstetrics;  Laterality: N/A;  . Tubal ligation  08/12/2012    Procedure: BILATERAL TUBAL LIGATION;  Surgeon: Tereso Newcomer, MD;  Location: [facility];  Service: Obstetrics;;    Family History  Problem Relation Age of Onset  . Other Neg Hx   . Hypertension Other   . Hypertension Mother   . Cancer Mother   . Cancer Paternal Uncle     History  Substance Use Topics  . Smoking status: Current Every Day Smoker -- 0.25 packs/day for 4 years    Types: Cigarettes  . Smokeless tobacco: Never Used  . Alcohol Use: No     Comment: occasional/social    Allergies:  Allergies  Allergen Reactions  . Naproxen Hives  . Motrin (Ibuprofen) Rash and Other (See Comments)    Redness and bumps on face    Prescriptions prior to admission  Medication Sig Dispense Refill  . Prenatal Vit-Fe Fumarate-FA (PRENATAL MULTIVITAMIN) TABS Take 1 tablet by mouth daily at 12 noon.         Review of Systems   Constitutional: Negative for fever and chills Eyes: Negative for visual disturbances Respiratory: Negative for shortness of breath, dyspnea Cardiovascular: Negative for chest pain or palpitations  Gastrointestinal: Negative  for vomiting, diarrhea and constipation.  Positive for uterine cramps Genitourinary: Negative for dysuria and urgency Musculoskeletal: Negative for back pain, joint pain, myalgias  Neurological: Negative for dizziness and headaches    Physical Exam   Blood pressure 125/88, pulse 64, temperature 97.3 F (36.3 C), temperature source Oral, resp. rate 18, height 5\' 2"  (1.575 m), weight 59.875 kg (132 lb), SpO2 100.00%, not currently breastfeeding.  General: General appearance - alert, well appearing, and in no distress and oriented to person, place, and time Chest - clear to auscultation, no wheezes, rales or rhonchi, symmetric air entry Heart - normal rate and regular rhythm Abdomen - soft, nontender, nondistended, no masses or organomegaly Pelvic - exam declined by the patient;  Spotting on panty liner Extremities - no pedal edema noted   Labs: No results found for this or any previous visit (from the past 24 hour(s)). Imaging Studies:  No results found.   Assessment: Patient Active Problem List   Diagnosis Date Noted  . S/P Repeat Cesarean Section and BTS 08/12/2012  . Low back pain 07/09/2012  . Desires Sterilization 07/09/2012  . Eczema 07/09/2012  . Drug use complicating pregnancy 06/17/2012  . Previous cesarean delivery, antepartum condition or complication 04/10/2012  . Supervision of high risk pregnancy in second trimester  04/07/2012  . Scoliosis 04/07/2012    Plan: Tramadol rx, comfort measures discussed  CRESENZO-DISHMAN,Kynslee Baham

## 2012-09-04 NOTE — MAU Note (Deleted)
C/o brown spotting and intermittent cramping since TUesday; bleeding was heavy and bright red on Tuesday;;

## 2012-09-04 NOTE — MAU Note (Signed)
Pt states had c/s 08/12/2012, has had spotting since then. Has had severe cramping, passed blood clot size of chicken nugget. Did note mucusy brown d/c last week.

## 2012-09-05 NOTE — MAU Provider Note (Signed)
Attestation of Attending Supervision of Advanced Practitioner (CNM/NP): Evaluation and management procedures were performed by the Advanced Practitioner under my supervision and collaboration.  I have reviewed the Advanced Practitioner's note and chart, and I agree with the management and plan.  Ashok Sawaya 09/05/2012 7:01 AM   

## 2012-09-11 ENCOUNTER — Ambulatory Visit: Payer: Self-pay | Admitting: Obstetrics & Gynecology

## 2012-10-18 ENCOUNTER — Inpatient Hospital Stay (HOSPITAL_COMMUNITY)
Admission: AD | Admit: 2012-10-18 | Discharge: 2012-10-19 | Disposition: A | Discharge status: Home / Self Care | Payer: Medicaid Other | Source: Ambulatory Visit | Attending: Obstetrics and Gynecology | Admitting: Obstetrics and Gynecology

## 2012-10-18 ENCOUNTER — Encounter (HOSPITAL_COMMUNITY): Payer: Self-pay | Admitting: *Deleted

## 2012-10-18 DIAGNOSIS — N949 Unspecified condition associated with female genital organs and menstrual cycle: Secondary | ICD-10-CM

## 2012-10-18 DIAGNOSIS — Z711 Person with feared health complaint in whom no diagnosis is made: Secondary | ICD-10-CM | POA: Insufficient documentation

## 2012-10-18 DIAGNOSIS — G43829 Menstrual migraine, not intractable, without status migrainosus: Secondary | ICD-10-CM

## 2012-10-18 DIAGNOSIS — R51 Headache: Secondary | ICD-10-CM | POA: Insufficient documentation

## 2012-10-18 MED ORDER — ACETAMINOPHEN-CODEINE #3 300-30 MG PO TABS: 1.0000 | ORAL_TABLET | ORAL | Status: DC | PRN

## 2012-10-18 MED ORDER — ONDANSETRON 8 MG PO TBDP
8.0000 mg | ORAL_TABLET | Freq: Once | ORAL | Status: DC
  Filled 2012-10-18: qty 1

## 2012-10-18 MED ORDER — ACETAMINOPHEN-CODEINE #3 300-30 MG PO TABS
1.0000 | ORAL_TABLET | ORAL | Status: DC | PRN
  Administered 2012-10-18: 1 via ORAL
  Filled 2012-10-18: qty 1

## 2012-10-18 MED ORDER — ONDANSETRON 8 MG PO TBDP: 8.0000 mg | ORAL_TABLET | Freq: Once | ORAL | Status: DC

## 2012-10-18 MED ORDER — ONDANSETRON 4 MG PO TBDP: 4.0000 mg | ORAL_TABLET | Freq: Four times a day (QID) | ORAL | Status: DC | PRN

## 2012-10-18 NOTE — MAU Provider Note (Signed)
History     CSN: [id number]  Arrival date and time: 10/18/12 2109   None     Chief Complaint  Patient presents with  . Vaginal Bleeding   HPI  Pt reports cycle started on Thursday.  Having the typical nausea and cramping with cycle.  Concerned because passed a nickel sized clot.  Also reports headache (termporal) with photophobia.  Took Pamprin with minimal relief.    Past Medical History  Diagnosis Date  . Scoliosis   . Back pain, chronic   . Anemia     with preg  . Urinary tract infection   . Chlamydia   . Reflux   . Headache(784.0)   . GERD (gastroesophageal reflux disease)     Past Surgical History  Procedure Laterality Date  . Cesarean section    . Back surgery      harrington rods  . Cesarean section N/A 08/12/2012    Procedure: CESAREAN SECTION;  Surgeon: Tereso Newcomer, MD;  Location: [facility];  Service: Obstetrics;  Laterality: N/A;  . Tubal ligation  08/12/2012    Procedure: BILATERAL TUBAL LIGATION;  Surgeon: Tereso Newcomer, MD;  Location: [facility];  Service: Obstetrics;;    Family History  Problem Relation Age of Onset  . Other Neg Hx   . Hypertension Other   . Hypertension Mother   . Cancer Mother   . Cancer Paternal Uncle     History  Substance Use Topics  . Smoking status: Current Every Day Smoker -- 0.25 packs/day for 4 years    Types: Cigarettes  . Smokeless tobacco: Never Used  . Alcohol Use: No     Comment: occasional/social    Allergies:  Allergies  Allergen Reactions  . Naproxen Hives  . Motrin (Ibuprofen) Rash and Other (See Comments)    Redness and bumps on face    Prescriptions prior to admission  Medication Sig Dispense Refill  . acetaminophen (TYLENOL) 325 MG tablet Take 650 mg by mouth every 6 (six) hours as needed for pain.      Marland Kitchen OVER THE COUNTER MEDICATION Take 2 tablets by mouth daily as needed (pain). Pt states taking OTC Pamprin, strength unknown.        Review of Systems  Constitutional: Negative for fever and  chills.  Gastrointestinal: Positive for nausea, vomiting and abdominal pain (cramps).  Genitourinary: Negative.   Neurological: Positive for headaches.  All other systems reviewed and are negative.   Physical Exam   Blood pressure 135/94, pulse 84, temperature 98.4 F (36.9 C), temperature source Oral, resp. rate 20, height 5\' 3"  (1.6 m), weight 56.813 kg (125 lb 4 oz), last menstrual period 10/16/2012.  Physical Exam  Constitutional: She is oriented to person, place, and time. She appears well-developed and well-nourished.  HENT:  Head: Normocephalic.  Eyes: EOM are normal. Pupils are equal, round, and reactive to light.  Neck: Normal range of motion. Neck supple.  Cardiovascular: Normal rate, regular rhythm and normal heart sounds.   Respiratory: Effort normal and breath sounds normal.  GI: Soft. She exhibits no mass. There is tenderness. There is no guarding.  Genitourinary: There is bleeding (negative clots) around the vagina.  Neurological: She is alert and oriented to person, place, and time. She has normal reflexes.  Skin: Skin is warm and dry.    MAU Course  Procedures  PO Zofran 8 mg ODT and Tylenol#3  Pt reports feeling better after PO meds  Urine Pregnancy - negative (per charge RN Stanton Kidney)  Assessment and Plan  Menstruation Headache  Plan: DC to home RX Tylenol #3 Zofran 8mg  Follow-up prn  [facility] 10/18/2012, 10:52 PM

## 2012-10-18 NOTE — MAU Note (Addendum)
PT SAYS  EVERYTHING OK BEFORE CYCLE STARTED ON Thursday-     ON THRSDAY-   BAD CRAMPS- TOOK TYLENOL- WITH RELIEF.   THEN Friday-  TOOK  PAMPRIN- HELPS TO SLEEP.   VOMITED X2  ON Friday.   NOW TODAY-   VOMITING X3 - ALWAYS HAS WITH CYCLE AND ALSO H/A TODAY  STARTED AT 2 PM   -- ALL THIS IS NL FOR HER   BUT TODAY  SAW A BLOOD CLOT - 1 NICKEL SIZE CLOT.      BTL IN 07-2012

## 2012-10-19 DIAGNOSIS — N949 Unspecified condition associated with female genital organs and menstrual cycle: Secondary | ICD-10-CM

## 2012-10-19 NOTE — MAU Provider Note (Signed)
Attestation of Attending Supervision of Advanced Practitioner (CNM/NP): Evaluation and management procedures were performed by the Advanced Practitioner under my supervision and collaboration.  I have reviewed the Advanced Practitioner's note and chart, and I agree with the management and plan.  Merrel Crabbe 10/19/2012 6:30 AM

## 2012-11-15 ENCOUNTER — Encounter (HOSPITAL_COMMUNITY): Payer: Self-pay | Admitting: *Deleted

## 2012-11-15 ENCOUNTER — Inpatient Hospital Stay (HOSPITAL_COMMUNITY)
Admission: AD | Admit: 2012-11-15 | Discharge: 2012-11-15 | Disposition: A | Discharge status: Home / Self Care | Payer: Self-pay | Source: Ambulatory Visit | Attending: Obstetrics & Gynecology | Admitting: Obstetrics & Gynecology

## 2012-11-15 DIAGNOSIS — R197 Diarrhea, unspecified: Secondary | ICD-10-CM | POA: Insufficient documentation

## 2012-11-15 DIAGNOSIS — N920 Excessive and frequent menstruation with regular cycle: Secondary | ICD-10-CM | POA: Insufficient documentation

## 2012-11-15 DIAGNOSIS — N946 Dysmenorrhea, unspecified: Secondary | ICD-10-CM | POA: Insufficient documentation

## 2012-11-15 DIAGNOSIS — R51 Headache: Secondary | ICD-10-CM | POA: Insufficient documentation

## 2012-11-15 LAB — URINALYSIS, ROUTINE W REFLEX MICROSCOPIC
Bilirubin Urine: NEGATIVE
Ketones, ur: 15 mg/dL — AB
Nitrite: NEGATIVE
Protein, ur: >300 mg/dL — AB
pH: 6 (ref 5.0–8.0)

## 2012-11-15 LAB — CBC
Hemoglobin: 13.3 g/dL (ref 12.0–15.0)
MCV: 78.2 fL (ref 78.0–100.0)
Platelets: 272 10*3/uL (ref 150–400)
RBC: 5.1 MIL/uL (ref 3.87–5.11)
WBC: 7.6 10*3/uL (ref 4.0–10.5)

## 2012-11-15 LAB — URINE MICROSCOPIC-ADD ON

## 2012-11-15 MED ORDER — ONDANSETRON 8 MG PO TBDP
8.0000 mg | ORAL_TABLET | Freq: Once | ORAL | Status: AC
  Administered 2012-11-15: 8 mg via ORAL
  Filled 2012-11-15: qty 1

## 2012-11-15 MED ORDER — TRAMADOL HCL 50 MG PO TABS: 50.0000 mg | ORAL_TABLET | Freq: Four times a day (QID) | ORAL | Status: DC | PRN

## 2012-11-15 MED ORDER — HYDROMORPHONE HCL PF 1 MG/ML IJ SOLN
1.0000 mg | Freq: Once | INTRAMUSCULAR | Status: DC
  Filled 2012-11-15: qty 1

## 2012-11-15 MED ORDER — HYDROMORPHONE HCL PF 1 MG/ML IJ SOLN
1.0000 mg | Freq: Once | INTRAMUSCULAR | Status: AC
  Administered 2012-11-15: 1 mg via INTRAMUSCULAR

## 2012-11-15 MED ORDER — DEXAMETHASONE SODIUM PHOSPHATE 10 MG/ML IJ SOLN
10.0000 mg | Freq: Once | INTRAMUSCULAR | Status: DC
  Filled 2012-11-15: qty 1

## 2012-11-15 MED ORDER — BUTALBITAL-APAP-CAFFEINE 50-325-40 MG PO TABS: 1.0000 | ORAL_TABLET | Freq: Four times a day (QID) | ORAL | Status: DC | PRN

## 2012-11-15 MED ORDER — BUTALBITAL-APAP-CAFFEINE 50-325-40 MG PO TABS
2.0000 | ORAL_TABLET | Freq: Once | ORAL | Status: AC
  Administered 2012-11-15: 2 via ORAL
  Filled 2012-11-15: qty 2

## 2012-11-15 MED ORDER — SODIUM CHLORIDE 0.9 % IV SOLN: INTRAVENOUS | Status: DC

## 2012-11-15 MED ORDER — DIPHENHYDRAMINE HCL 50 MG/ML IJ SOLN
25.0000 mg | Freq: Once | INTRAMUSCULAR | Status: DC
  Filled 2012-11-15: qty 1

## 2012-11-15 MED ORDER — DEXAMETHASONE SODIUM PHOSPHATE 10 MG/ML IJ SOLN
10.0000 mg | Freq: Once | INTRAMUSCULAR | Status: AC
  Administered 2012-11-15: 10 mg via INTRAMUSCULAR

## 2012-11-15 MED ORDER — METOCLOPRAMIDE HCL 5 MG/ML IJ SOLN
10.0000 mg | Freq: Once | INTRAMUSCULAR | Status: DC
  Filled 2012-11-15: qty 2

## 2012-11-15 MED ORDER — MEDROXYPROGESTERONE ACETATE 150 MG/ML IM SUSP
150.0000 mg | Freq: Once | INTRAMUSCULAR | Status: AC
  Administered 2012-11-15: 150 mg via INTRAMUSCULAR
  Filled 2012-11-15: qty 1

## 2012-11-15 NOTE — MAU Note (Signed)
Pt. Here due to migraine and severe menstrual cramping. Also noted to have heavy bleeding. Pt. States this happens each month. Did not take any pain medication today. Normally takes Excedrin migraine. Has also been vomiting for two days. Pt. Also notes to have low back pain.

## 2012-11-15 NOTE — MAU Note (Signed)
Bleeding started on Wed, all the pain, headache and vomiting started with it.  Has not eaten in the past 2 days

## 2012-11-15 NOTE — MAU Provider Note (Signed)
History     CSN: [id number]  Arrival date and time: 11/15/12 1452   First Provider Initiated Contact with Patient 11/15/12 1532      Chief Complaint  Patient presents with  . Vaginal Bleeding  . Headache  . Abdominal Pain  . Back Pain   HPI Debra Jensen is a 22 y.o. [phone] who presents to MAU today with complaint of dysmenorrhea, migraine and N/V. The patient delivered her last child in April 2014 and had BTL. She restarted periods in June 2014 and has had similar issues with each period since then. She states that she feels worse today than the last few months. She was here last month with the same issues. LMP was 11/12/12. She is bleeding heavily and changing a pad q 20-30 minutes. She is having lower abdominal and low back pain. She states that migraines are common with periods for her. Headache is the worst pain right now. She has had N/V/D recently and had fever of 101F yesterday per patient. She denies sick contacts or UTI symptoms. She no longer has any of her migraine medications left at home.    OB History   Grav Para Term Preterm Abortions TAB SAB Ect Mult Living   3 3 3  0 0 0 0 0 0 3      Past Medical History  Diagnosis Date  . Scoliosis   . Back pain, chronic   . Anemia     with preg  . Urinary tract infection   . Chlamydia   . Reflux   . Headache(784.0)   . GERD (gastroesophageal reflux disease)     Past Surgical History  Procedure Laterality Date  . Cesarean section    . Back surgery      harrington rods  . Cesarean section N/A 08/12/2012    Procedure: CESAREAN SECTION;  Surgeon: Tereso Newcomer, MD;  Location: [facility];  Service: Obstetrics;  Laterality: N/A;  . Tubal ligation  08/12/2012    Procedure: BILATERAL TUBAL LIGATION;  Surgeon: Tereso Newcomer, MD;  Location: [facility];  Service: Obstetrics;;    Family History  Problem Relation Age of Onset  . Other Neg Hx   . Hypertension Other   . Hypertension Mother   . Cancer Mother   . Cancer  Paternal Uncle     History  Substance Use Topics  . Smoking status: Current Every Day Smoker -- 0.25 packs/day for 4 years    Types: Cigarettes  . Smokeless tobacco: Never Used  . Alcohol Use: No     Comment: occasional/social    Allergies:  Allergies  Allergen Reactions  . Naproxen Hives  . Motrin (Ibuprofen) Rash and Other (See Comments)    Redness and bumps on face    Prescriptions prior to admission  Medication Sig Dispense Refill  . ondansetron (ZOFRAN-ODT) 8 MG disintegrating tablet Take 1 tablet (8 mg total) by mouth once.  20 tablet  0  . [DISCONTINUED] acetaminophen-codeine (TYLENOL #3) 300-30 MG per tablet Take 1 tablet by mouth every 4 (four) hours as needed.  30 tablet  0    Review of Systems  Constitutional: Positive for fever. Negative for malaise/fatigue.  Gastrointestinal: Positive for nausea, vomiting, abdominal pain and diarrhea. Negative for constipation.  Genitourinary: Negative for dysuria, urgency and frequency.       + vaginal bleeding Neg - vaginal discharge  Neurological: Positive for dizziness. Negative for loss of consciousness and weakness.   Physical Exam   Blood  pressure 126/86, pulse 90, temperature 98.2 F (36.8 C), temperature source Oral, resp. rate 18, weight 120 lb (54.432 kg), last menstrual period 11/12/2012.  Physical Exam  Constitutional: She is oriented to person, place, and time. She appears well-developed and well-nourished. No distress.  HENT:  Head: Normocephalic and atraumatic.  Cardiovascular: Normal rate, regular rhythm and normal heart sounds.   Respiratory: Effort normal and breath sounds normal. No respiratory distress.  GI: Soft. Bowel sounds are normal. She exhibits no distension and no mass. There is tenderness (moderate diffuse tenderness to palpation of the abdomen). There is no rebound and no guarding.  Neurological: She is alert and oriented to person, place, and time.  Skin: Skin is warm and dry. No erythema.   Psychiatric: She has a normal mood and affect.   Results for orders placed during the hospital encounter of 11/15/12 (from the past 24 hour(s))  URINALYSIS, ROUTINE W REFLEX MICROSCOPIC     Status: Abnormal   Collection Time    11/15/12  3:21 PM      Result Value Range   Color, Urine RED (*) YELLOW   APPearance TURBID (*) CLEAR   Specific Gravity, Urine >1.030 (*) 1.005 - 1.030   pH 6.0  5.0 - 8.0   Glucose, UA NEGATIVE  NEGATIVE mg/dL   Hgb urine dipstick LARGE (*) NEGATIVE   Bilirubin Urine NEGATIVE  NEGATIVE   Ketones, ur 15 (*) NEGATIVE mg/dL   Protein, ur >478 (*) NEGATIVE mg/dL   Urobilinogen, UA 0.2  0.0 - 1.0 mg/dL   Nitrite NEGATIVE  NEGATIVE   Leukocytes, UA TRACE (*) NEGATIVE  POCT PREGNANCY, URINE     Status: None   Collection Time    11/15/12  3:21 PM      Result Value Range   Preg Test, Ur NEGATIVE  NEGATIVE  URINE MICROSCOPIC-ADD ON     Status: Abnormal   Collection Time    11/15/12  3:21 PM      Result Value Range   Squamous Epithelial / LPF FEW (*) RARE   WBC, UA 0-2  <3 WBC/hpf   RBC / HPF TOO NUMEROUS TO COUNT  <3 RBC/hpf   Bacteria, UA RARE  RARE  CBC     Status: Abnormal   Collection Time    11/15/12  5:15 PM      Result Value Range   WBC 7.6  4.0 - 10.5 K/uL   RBC 5.10  3.87 - 5.11 MIL/uL   Hemoglobin 13.3  12.0 - 15.0 g/dL   HCT 29.5  62.1 - 30.8 %   MCV 78.2  78.0 - 100.0 fL   MCH 26.1  26.0 - 34.0 pg   MCHC 33.3  30.0 - 36.0 g/dL   RDW 65.7 (*) 84.6 - 96.2 %   Platelets 272  150 - 400 K/uL    MAU Course  Procedures None  MDM UPT - negative UA, CBC today Unable to obtain IV access. Patient given ODT Zofran, IM decadron and dilaudid. Patient reports improvement in symptoms. Able to tolerate PO intake and hydrate. Fioricet given for headache. Patient reports significant improvement in symptoms.  Patient is hemodynamically stable Depo provera given in MAU today for menorrhagia.  Assessment and Plan   A: Menorrhagia Dysmenorrhea Headache  P: Discharge home Rx for Fioricet and Tramadol sent to patient's pharmacy Patient referred to [facility] clinic for follow-up and management Patient may return to MAU as needed or if her condition were to change or worsen  Freddi Starr, PA-C  11/15/2012, 5:51 PM

## 2012-11-16 NOTE — MAU Provider Note (Signed)

## 2012-12-25 ENCOUNTER — Encounter: Payer: Self-pay | Admitting: Obstetrics & Gynecology

## 2012-12-25 ENCOUNTER — Ambulatory Visit (INDEPENDENT_AMBULATORY_CARE_PROVIDER_SITE_OTHER): Payer: Medicaid Other | Admitting: Obstetrics & Gynecology

## 2012-12-25 VITALS — BP 124/91 | HR 94 | Temp 97.9°F | Ht 62.0 in | Wt 125.2 lb

## 2012-12-25 DIAGNOSIS — N946 Dysmenorrhea, unspecified: Secondary | ICD-10-CM

## 2012-12-25 MED ORDER — PROPOXYPHENE N-APAP 50-325 MG PO TABS: 1.0000 | ORAL_TABLET | Freq: Four times a day (QID) | ORAL | Status: DC | PRN

## 2012-12-25 MED ORDER — ACETAMINOPHEN-CODEINE 300-30 MG PO TABS: 1.0000 | ORAL_TABLET | ORAL | Status: DC | PRN

## 2012-12-25 MED ORDER — NORETHIN-ETH ESTRADIOL-FE 0.4-35 MG-MCG PO CHEW: 1.0000 | CHEWABLE_TABLET | Freq: Every day | ORAL | Status: DC

## 2012-12-25 NOTE — Patient Instructions (Addendum)
Dysmenorrhea  Menstrual pain is caused by the muscles of the uterus tightening (contracting) during a menstrual period. The muscles of the uterus contract due to the chemicals in the uterine lining.  Primary dysmenorrhea is menstrual cramps that last a couple of days when you start having menstrual periods or soon after. This often begins after a teenager starts having her period. As a woman gets older or has a baby, the cramps will usually lesson or disappear.  Secondary dysmenorrhea begins later in life, lasts longer, and the pain may be stronger than primary dysmenorrhea. The pain may start before the period and last a few days after the period. This type of dysmenorrhea is usually caused by an underlying problem such as:   The tissue lining the uterus grows outside of the uterus in other areas of the body (endometriosis).   The endometrial tissue, which normally lines the uterus, is found in or grows into the muscular walls of the uterus (adenomyosis).   The pelvic blood vessels are engorged with blood just before the menstrual period (pelvic congestive syndrome).   Overgrowth of cells in the lining of the uterus or cervix (polyps of the uterus or cervix).   Falling down of the uterus (prolapse) because of loose or stretched ligaments.   Depression.   Bladder problems, infection, or inflammation.   Problems with the intestine, a tumor, or irritable bowel syndrome.   Cancer of the female organs or bladder.   A severely tipped uterus.   A very tight opening or closed cervix.   Noncancerous tumors of the uterus (fibroids).   Pelvic inflammatory disease (PID).   Pelvic scarring (adhesions) from a previous surgery.   Ovarian cyst.   An intrauterine device (IUD) used for birth control.  CAUSES   The cause of menstrual pain is often unknown.  SYMPTOMS    Cramping or throbbing pain in your lower abdomen.   Sometimes, a woman may also experience headaches.   Lower back pain.   Feeling sick to your  stomach (nausea) or vomiting.   Diarrhea.   Sweating or dizziness.  DIAGNOSIS   A diagnosis is based on your history, symptoms, physical examination, diagnostic tests, or procedures. Diagnostic tests or procedures may include:   Blood tests.   An ultrasound.   An examination of the lining of the uterus (dilation and curettage, D&C).   An examination inside your abdomen or pelvis with a scope (laparoscopy).   X-rays.   CT Scan.   MRI.   An examination inside the bladder with a scope (cystoscopy).   An examination inside the intestine or stomach with a scope (colonoscopy, gastroscopy).  TREATMENT   Treatment depends on the cause of the dysmenorrhea. Treatment may include:   Pain medicine prescribed by your caregiver.   Birth control pills.   Hormone replacement therapy.   Nonsteroidal anti-inflammatory drugs (NSAIDs). These may help stop the production of prostaglandins.   An IUD with progesterone hormone in it.   Acupuncture.   Surgery to remove adhesions, endometriosis, ovarian cyst, or fibroids.   Removal of the uterus (hysterectomy).   Progesterone shots to stop the menstrual period.   Cutting the nerves on the sacrum that go to the female organs (presacral neurectomy).   Electric currant to the sacral nerves (sacral nerve stimulation).   Antidepressant medicine.   Psychiatric therapy, counseling, or group therapy.   Exercise and physical therapy.   Meditation and yoga therapy.  HOME CARE INSTRUCTIONS    Only take over-the-counter   may help.  Stop smoking.  Avoid alcohol and caffeine.  Yoga, meditation, or acupuncture may help. SEEK MEDICAL CARE IF:    The pain does not get better with medicine.  You have pain with sexual intercourse. SEEK IMMEDIATE MEDICAL CARE IF:   Your pain increases and is not controlled with medicines.  You have a fever.  You develop nausea or vomiting with your period not controlled with medicine.  You have abnormal vaginal bleeding with your period.  You pass out. MAKE SURE YOU:   Understand these instructions.  Will watch your condition.  Will get help right away if you are not doing well or get worse. Document Released: 04/09/2005 Document Revised: 07/02/2011 Document Reviewed: 07/26/2008 [facility] Patient Information 2014 [place], [place]. Levonorgestrel intrauterine device (IUD) What is this medicine? LEVONORGESTREL IUD (LEE voe nor jes trel) is a contraceptive (birth control) device. It is used to prevent pregnancy and to treat heavy bleeding that occurs during your period. It can be used for up to 5 years. This medicine may be used for other purposes; ask your health care provider or pharmacist if you have questions. What should I tell my health care provider before I take this medicine? They need to know if you have any of these conditions: -abnormal Pap smear -cancer of the breast, uterus, or cervix -diabetes -endometritis -genital or pelvic infection now or in the past -have more than one sexual partner or your partner has more than one partner -heart disease -history of an ectopic or tubal pregnancy -immune system problems -IUD in place -liver disease or tumor -problems with blood clots or take blood-thinners -use intravenous drugs -uterus of unusual shape -vaginal bleeding that has not been explained -an unusual or allergic reaction to levonorgestrel, other hormones, silicone, or polyethylene, medicines, foods, dyes, or preservatives -pregnant or trying to get pregnant -breast-feeding How should I use this medicine? This device is placed inside the uterus by a health care  professional. Talk to your pediatrician regarding the use of this medicine in children. Special care may be needed. Overdosage: If you think you have taken too much of this medicine contact a poison control center or emergency room at once. NOTE: This medicine is only for you. Do not share this medicine with others. What if I miss a dose? This does not apply. What may interact with this medicine? Do not take this medicine with any of the following medications: -amprenavir -bosentan -fosamprenavir This medicine may also interact with the following medications: -aprepitant -barbiturate medicines for inducing sleep or treating seizures -bexarotene -griseofulvin -medicines to treat seizures like carbamazepine, ethotoin, felbamate, oxcarbazepine, phenytoin, topiramate -modafinil -pioglitazone -rifabutin -rifampin -rifapentine -some medicines to treat HIV infection like atazanavir, indinavir, lopinavir, nelfinavir, tipranavir, ritonavir -St. John's wort -warfarin This list may not describe all possible interactions. Give your health care provider a list of all the medicines, herbs, non-prescription drugs, or dietary supplements you use. Also tell them if you smoke, drink alcohol, or use illegal drugs. Some items may interact with your medicine. What should I watch for while using this medicine? Visit your doctor or health care professional for regular check ups. See your doctor if you or your partner has sexual contact with others, becomes HIV positive, or gets a sexual transmitted disease. This product does not protect you against HIV infection (AIDS) or other sexually transmitted diseases. You can check the placement of the IUD yourself by reaching up to the top of your vagina with clean fingers to feel the threads.  Do not pull on the threads. It is a good habit to check placement after each menstrual period. Call your doctor right away if you feel more of the IUD than just the threads or if  you cannot feel the threads at all. The IUD may come out by itself. You may become pregnant if the device comes out. If you notice that the IUD has come out use a backup birth control method like condoms and call your health care provider. Using tampons will not change the position of the IUD and are okay to use during your period. What side effects may I notice from receiving this medicine? Side effects that you should report to your doctor or health care professional as soon as possible: -allergic reactions like skin rash, itching or hives, swelling of the face, lips, or tongue -fever, flu-like symptoms -genital sores -high blood pressure -no menstrual period for 6 weeks during use -pain, swelling, warmth in the leg -pelvic pain or tenderness -severe or sudden headache -signs of pregnancy -stomach cramping -sudden shortness of breath -trouble with balance, talking, or walking -unusual vaginal bleeding, discharge -yellowing of the eyes or skin Side effects that usually do not require medical attention (report to your doctor or health care professional if they continue or are bothersome): -acne -breast pain -change in sex drive or performance -changes in weight -cramping, dizziness, or faintness while the device is being inserted -headache -irregular menstrual bleeding within first 3 to 6 months of use -nausea This list may not describe all possible side effects. Call your doctor for medical advice about side effects. You may report side effects to FDA at 1-800-FDA-1088. Where should I keep my medicine? This does not apply. NOTE: This sheet is a summary. It may not cover all possible information. If you have questions about this medicine, talk to your doctor, pharmacist, or health care provider.  2012, [employer] Standard. (04/30/2008 6:39:08 PM)

## 2012-12-25 NOTE — Progress Notes (Signed)
Subjective:     Patient ID: Debra Jensen, female   DOB: 1990-12-30, 22 y.o.   MRN: [id number]  HPI Pt presents with c/o dysmenorrhea beginning to get extremely worse after her BTL.  Pt reports that she has N/V with her cycles that is resolved with Zofran.  She also has HA that are resolved with Fioricet  Review of Systems     Objective:   Physical Exam BP 124/91  Pulse 94  Temp(Src) 97.9 F (36.6 C) (Oral)  Ht 5\' 2"  (1.575 m)  Wt 125 lb 3.2 oz (56.79 kg)  BMI 22.89 kg/m2  LMP 12/03/2012  Breastfeeding? No Exam deferred     Assessment:     dysmenorrhea worse after BTL    Plan:     Begin FemCon in 3 days F/u 2 months or sooner prn Tylenol #3 (pt declined higher dose narcotics due to worry of feeling nauseated)

## 2013-01-07 ENCOUNTER — Inpatient Hospital Stay (HOSPITAL_COMMUNITY)
Admission: AD | Admit: 2013-01-07 | Discharge: 2013-01-07 | Disposition: A | Discharge status: Home / Self Care | Payer: Self-pay | Source: Ambulatory Visit | Attending: Obstetrics and Gynecology | Admitting: Obstetrics and Gynecology

## 2013-01-07 ENCOUNTER — Encounter (HOSPITAL_COMMUNITY): Payer: Self-pay | Admitting: *Deleted

## 2013-01-07 DIAGNOSIS — G43909 Migraine, unspecified, not intractable, without status migrainosus: Secondary | ICD-10-CM

## 2013-01-07 DIAGNOSIS — N946 Dysmenorrhea, unspecified: Secondary | ICD-10-CM

## 2013-01-07 DIAGNOSIS — R109 Unspecified abdominal pain: Secondary | ICD-10-CM | POA: Insufficient documentation

## 2013-01-07 MED ORDER — SUMATRIPTAN SUCCINATE 100 MG PO TABS: 100.0000 mg | ORAL_TABLET | Freq: Once | ORAL | Status: DC | PRN

## 2013-01-07 MED ORDER — NORGESTIMATE-ETH ESTRADIOL 0.25-35 MG-MCG PO TABS: 1.0000 | ORAL_TABLET | Freq: Every day | ORAL | Status: DC

## 2013-01-07 NOTE — MAU Provider Note (Signed)
History     CSN: [id number]  Arrival date and time: 01/07/13 2011   None     Chief Complaint  Patient presents with  . Vaginal Bleeding  . Headache  . Abdominal Cramping   HPI Comments: Debra Jensen 22 y.o. [phone] comes in with 2 complaints. First is vaginal bleeding ongoing after depo provera. She was seen at [facility] on 9/4 and given birth control pills to stop bleeding. She said she was unable to fill RX due to cost. She has taken all of the pain medication for her " chronic back pain " and no longer has any for her menses pain. She requests more pain medications. 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 She said she called back to the clinic to tell them that and they never returned her phone call. Second issue is of migraine. She is allergic to NSAIDS and refused Imitrex.     Vaginal Bleeding Associated symptoms include abdominal pain and back pain.  Headache  Associated symptoms include abdominal pain and back pain.  Abdominal Cramping      Past Medical History  Diagnosis Date  . Scoliosis   . Back pain, chronic   . Anemia     with preg  . Urinary tract infection   . Chlamydia   . Reflux   . Headache(784.0)   . GERD (gastroesophageal reflux disease)     Past Surgical History  Procedure Laterality Date  . Cesarean section    . Back surgery      harrington rods  . Cesarean section N/A 08/12/2012    Procedure: CESAREAN SECTION;  Surgeon: Tereso Newcomer, MD;  Location: [facility];  Service: Obstetrics;  Laterality: N/A;  . Tubal ligation  08/12/2012    Procedure: BILATERAL TUBAL LIGATION;  Surgeon: Tereso Newcomer, MD;  Location: [facility];  Service: Obstetrics;;    Family History  Problem Relation Age of Onset  . Other Neg Hx   . Hypertension Other   . Hypertension Mother   . Cancer Mother   . Cancer Paternal Uncle     History  Substance Use Topics  . Smoking status: Current Every Day Smoker -- 0.25  packs/day for 4 years    Types: Cigarettes  . Smokeless tobacco: Never Used  . Alcohol Use: No     Comment: occasional/social    Allergies:  Allergies  Allergen Reactions  . Naproxen Hives  . Motrin [Ibuprofen] Rash and Other (See Comments)    Redness and bumps on face    Prescriptions prior to admission  Medication Sig Dispense Refill  . Acetaminophen-Codeine 300-30 MG per tablet Take 1 tablet by mouth every 4 (four) hours as needed for pain.  30 tablet  0  . Norethin-Eth Estradiol-Fe [facility] FE,WYMZYA Cecille [place]) 0.4-35 MG-MCG tablet Chew 1 tablet by mouth daily.  1 Package  11  . [DISCONTINUED] ondansetron (ZOFRAN-ODT) 8 MG disintegrating tablet Take 1 tablet (8 mg total) by mouth once.  20 tablet  0    Review of Systems  Constitutional: Negative.   Respiratory: Negative.   Gastrointestinal: Positive for abdominal pain.  Genitourinary: Negative.   Musculoskeletal: Positive for back pain.  Skin: Negative.   Neurological: Negative.   Psychiatric/Behavioral: Negative.    Physical Exam   Blood pressure 110/49, pulse 87, temperature 98.3 F (36.8 C), temperature source Oral, resp. rate 20, height 5\' 2"  (1.575 m), weight 129 lb (58.514 kg), last menstrual period 12/03/2012, SpO2  100.00%.  Physical Exam  Constitutional: She is oriented to person, place, and time. She appears well-developed and well-nourished. No distress.  HENT:  Head: Normocephalic.  Eyes: Pupils are equal, round, and reactive to light.  Cardiovascular: Normal rate, regular rhythm and normal heart sounds.   Respiratory: Effort normal and breath sounds normal. No respiratory distress. She has no wheezes. She has no rales. She exhibits no tenderness.  GI: Soft. Bowel sounds are normal.  Genitourinary:  Declined exam  Musculoskeletal: Normal range of motion.  Neurological: She is alert and oriented to person, place, and time.  Skin: Skin is warm and dry.  Psychiatric: She has a normal mood and  affect.    MAU Course  Procedures  MDM    Assessment and Plan   A:: Dysmenorrhea Migraine  P: New RX for Ortho-Cyclen po qday # 3 pack [facility] Imitrex 100mg  prn migraine  Carolynn Serve 01/07/2013, 10:06 PM

## 2013-01-07 NOTE — MAU Note (Signed)
Pt reports when asked what is going on today pt replies it is the same thing it has always been. States she was given Depo here in July (29th) and has been bleeding ever since, states she is having abd cramps off/on , is hurting today in her lower abd. Also reports a migraine which she says started after she got the Depo.

## 2013-01-08 NOTE — MAU Provider Note (Signed)
Attestation of Attending Supervision of Advanced Practitioner: Evaluation and management procedures were performed by the PA/NP/CNM/OB Fellow under my supervision/collaboration. Chart reviewed and agree with management and plan.  Tameyah Koch V 01/08/2013 11:29 PM

## 2013-03-31 ENCOUNTER — Encounter (HOSPITAL_COMMUNITY): Payer: Self-pay | Admitting: Emergency Medicine

## 2013-03-31 ENCOUNTER — Emergency Department (HOSPITAL_COMMUNITY)
Admission: EM | Admit: 2013-03-31 | Discharge: 2013-03-31 | Disposition: A | Discharge status: Home / Self Care | Payer: Self-pay | Attending: Emergency Medicine | Admitting: Emergency Medicine

## 2013-03-31 DIAGNOSIS — Z862 Personal history of diseases of the blood and blood-forming organs and certain disorders involving the immune mechanism: Secondary | ICD-10-CM | POA: Insufficient documentation

## 2013-03-31 DIAGNOSIS — Z8719 Personal history of other diseases of the digestive system: Secondary | ICD-10-CM | POA: Insufficient documentation

## 2013-03-31 DIAGNOSIS — Z3202 Encounter for pregnancy test, result negative: Secondary | ICD-10-CM | POA: Insufficient documentation

## 2013-03-31 DIAGNOSIS — N390 Urinary tract infection, site not specified: Secondary | ICD-10-CM | POA: Insufficient documentation

## 2013-03-31 DIAGNOSIS — Z8619 Personal history of other infectious and parasitic diseases: Secondary | ICD-10-CM | POA: Insufficient documentation

## 2013-03-31 DIAGNOSIS — Z8739 Personal history of other diseases of the musculoskeletal system and connective tissue: Secondary | ICD-10-CM | POA: Insufficient documentation

## 2013-03-31 DIAGNOSIS — F17200 Nicotine dependence, unspecified, uncomplicated: Secondary | ICD-10-CM | POA: Insufficient documentation

## 2013-03-31 DIAGNOSIS — F411 Generalized anxiety disorder: Secondary | ICD-10-CM | POA: Insufficient documentation

## 2013-03-31 DIAGNOSIS — N764 Abscess of vulva: Secondary | ICD-10-CM | POA: Insufficient documentation

## 2013-03-31 LAB — URINALYSIS, ROUTINE W REFLEX MICROSCOPIC
Glucose, UA: NEGATIVE mg/dL
Nitrite: POSITIVE — AB
Specific Gravity, Urine: 1.009 (ref 1.005–1.030)
pH: 5.5 (ref 5.0–8.0)

## 2013-03-31 LAB — URINE MICROSCOPIC-ADD ON

## 2013-03-31 MED ORDER — CEPHALEXIN 500 MG PO CAPS: 500.0000 mg | ORAL_CAPSULE | Freq: Two times a day (BID) | ORAL | Status: DC

## 2013-03-31 MED ORDER — HYDROCODONE-ACETAMINOPHEN 5-325 MG PO TABS: 1.0000 | ORAL_TABLET | ORAL | Status: DC | PRN

## 2013-03-31 MED ORDER — SULFAMETHOXAZOLE-TRIMETHOPRIM 800-160 MG PO TABS: 1.0000 | ORAL_TABLET | Freq: Two times a day (BID) | ORAL | Status: DC

## 2013-03-31 MED ORDER — OXYCODONE-ACETAMINOPHEN 5-325 MG PO TABS
2.0000 | ORAL_TABLET | Freq: Once | ORAL | Status: AC
  Administered 2013-03-31: 2 via ORAL
  Filled 2013-03-31: qty 2

## 2013-03-31 NOTE — ED Provider Notes (Signed)
CSN: [id number]     Arrival date & time 03/31/13  2014 History   None    Chief Complaint  Patient presents with  . Vaginal Pain  . Insect Bite    HPI  Debra Jensen is a 22 y.o. female with a PMH of anemia, UTI, chlamydia, headache, scoliosis, back pain, GERD who presents to the ED for evaluation of vaginal pain and an insect bite.  History was provided by the patient.  Patient states that she has had a small bump on her right labia for an unknown amount of time. She states that the bump has been growing in size. She states that she thought maybe she got bit by an insect, but is unsure. She denies any drainage from the wound. She has been applying warm compresses with no relief. She has never had an abscess or a Bartholin's cyst in the past.  She denies any other genital sores, vaginal discharge, vaginal bleeding, abdominal pain, pelvic pain, fever, nausea, vomiting, diarrhea, constipation, or change in appetite or activity. She has also been taking Tylenol with no relief in her pain. She states that her pain is worse with walking, palpation, and movement.  She did not currently have an OB/GYN.  She is currently sexually active with no new partners. She does not use any means of birth control. She has a history of tubal ligation.    Past Medical History  Diagnosis Date  . Scoliosis   . Back pain, chronic   . Anemia     with preg  . Urinary tract infection   . Chlamydia   . Reflux   . Headache(784.0)   . GERD (gastroesophageal reflux disease)    Past Surgical History  Procedure Laterality Date  . Cesarean section    . Back surgery      harrington rods  . Cesarean section N/A 08/12/2012    Procedure: CESAREAN SECTION;  Surgeon: Tereso Newcomer, MD;  Location: [facility];  Service: Obstetrics;  Laterality: N/A;  . Tubal ligation  08/12/2012    Procedure: BILATERAL TUBAL LIGATION;  Surgeon: Tereso Newcomer, MD;  Location: [facility];  Service: Obstetrics;;   Family History  Problem Relation  Age of Onset  . Other Neg Hx   . Hypertension Other   . Hypertension Mother   . Cancer Mother   . Cancer Paternal Uncle    History  Substance Use Topics  . Smoking status: Current Every Day Smoker -- 0.25 packs/day for 4 years    Types: Cigarettes  . Smokeless tobacco: Never Used  . Alcohol Use: No     Comment: occasional/social   OB History   Grav Para Term Preterm Abortions TAB SAB Ect Mult Living   3 3 3  0 0 0 0 0 0 3     Review of Systems  Constitutional: Negative for fever, chills, diaphoresis, activity change, appetite change and fatigue.  Eyes: Negative for visual disturbance.  Respiratory: Negative for cough and shortness of breath.   Cardiovascular: Negative for chest pain.  Gastrointestinal: Negative for nausea, vomiting, abdominal pain, diarrhea and constipation.  Genitourinary: Positive for genital sores. Negative for dysuria, urgency, hematuria, flank pain, decreased urine volume, vaginal bleeding, vaginal discharge, difficulty urinating, vaginal pain, pelvic pain and dyspareunia.  Musculoskeletal: Negative for back pain and myalgias.  Skin: Positive for color change and wound.  Neurological: Negative for dizziness, weakness and headaches.    Allergies  Naproxen and Motrin  Home Medications  No current outpatient  prescriptions on file. BP 126/75  Pulse 82  Temp(Src) 98.4 F (36.9 C) (Oral)  Resp 20  SpO2 98%  Filed Vitals:   03/31/13 2020 03/31/13 2046 03/31/13 2100 03/31/13 2200  BP: 132/96 126/78 126/75 120/85  Pulse: 118 79 82 82  Temp: 98.5 F (36.9 C) 98.4 F (36.9 C)    TempSrc: Oral Oral    Resp: 22 20    SpO2: 98% 97% 98% 100%     Physical Exam  Nursing note and vitals reviewed. Constitutional: She is oriented to person, place, and time. She appears well-developed and well-nourished. No distress.  Patient anxious  HENT:  Head: Normocephalic and atraumatic.  Right Ear: External ear normal.  Left Ear: External ear normal.  Nose: Nose  normal.  Mouth/Throat: Oropharynx is clear and moist.  Eyes: Conjunctivae are normal. Pupils are equal, round, and reactive to light. Right eye exhibits no discharge. Left eye exhibits no discharge.  Neck: Normal range of motion.  Cardiovascular: Normal rate, regular rhythm, normal heart sounds and intact distal pulses.  Exam reveals no gallop and no friction rub.   No murmur heard. Pulmonary/Chest: Effort normal and breath sounds normal. No respiratory distress. She has no wheezes. She has no rales. She exhibits no tenderness.  Abdominal: Soft. Bowel sounds are normal. She exhibits no distension and no mass. There is no tenderness. There is no rebound and no guarding.  Genitourinary:     2 cm abscess to the right superior labia majora with overlying erythema.  No open wounds or evidence of drainage.  Area is tender to palpation.   Musculoskeletal: Normal range of motion. She exhibits no edema and no tenderness.  Neurological: She is alert and oriented to person, place, and time.  Skin: Skin is warm and dry. She is not diaphoretic.    ED Course  INCISION AND DRAINAGE Date/Time: 03/31/2013 10:00 PM Performed by: Coral Ceo K Authorized by: Jillyn Ledger Consent: Verbal consent obtained. Consent given by: patient Type: abscess Body area: anogenital Location details: vulva Anesthesia: local infiltration Local anesthetic: lidocaine 1% without epinephrine Anesthetic total: 2 ml Patient sedated: no Scalpel size: 11 Complexity: simple Drainage: bloody Drainage amount: scant Patient tolerance: Patient tolerated the procedure well with no immediate complications.   (including critical care time) Labs Review Labs Reviewed  URINALYSIS, ROUTINE W REFLEX MICROSCOPIC - Abnormal; Notable for the following:    APPearance CLOUDY (*)    Hgb urine dipstick SMALL (*)    Nitrite POSITIVE (*)    Leukocytes, UA TRACE (*)    All other components within normal limits  URINE  MICROSCOPIC-ADD ON - Abnormal; Notable for the following:    Squamous Epithelial / LPF MANY (*)    Bacteria, UA MANY (*)    All other components within normal limits  URINE CULTURE  POCT PREGNANCY, URINE   Imaging Review No results found.  EKG Interpretation   None      Results for orders placed during the hospital encounter of 03/31/13  URINALYSIS, ROUTINE W REFLEX MICROSCOPIC      Result Value Range   Color, Urine YELLOW  YELLOW   APPearance CLOUDY (*) CLEAR   Specific Gravity, Urine 1.009  1.005 - 1.030   pH 5.5  5.0 - 8.0   Glucose, UA NEGATIVE  NEGATIVE mg/dL   Hgb urine dipstick SMALL (*) NEGATIVE   Bilirubin Urine NEGATIVE  NEGATIVE   Ketones, ur NEGATIVE  NEGATIVE mg/dL   Protein, ur NEGATIVE  NEGATIVE mg/dL  Urobilinogen, UA 0.2  0.0 - 1.0 mg/dL   Nitrite POSITIVE (*) NEGATIVE   Leukocytes, UA TRACE (*) NEGATIVE  URINE MICROSCOPIC-ADD ON      Result Value Range   Squamous Epithelial / LPF MANY (*) RARE   WBC, UA 7-10  <3 WBC/hpf   RBC / HPF 3-6  <3 RBC/hpf   Bacteria, UA MANY (*) RARE  POCT PREGNANCY, URINE      Result Value Range   Preg Test, Ur NEGATIVE  NEGATIVE    MDM   Debra Jensen is a 22 y.o. female with a PMH of anemia, UTI, chlamydia, headache, scoliosis, back pain, GERD who presents to the ED for evaluation of vaginal pain and an insect bite.    Patient found to have a vulvar abscess to the right superior labia majora.  This may have been due to shaving/follulitis.  Incision and drainage was performed in the emergency room. Patient was prescribed antibiotics Keflex and Bactrim. She was given a prescription for Vicodin for outpatient pain management control. She also was found to have a urinary tract infection. Urine sent for culture. Patient given referral to women's health for follow-up in one to 2 days.  Return precautions were discussed. Patient was in agreement with discharge and plan. Friend present in the emergency room to drive the patient  home.    Discharge Medication List as of 03/31/2013 11:05 PM    START taking these medications   Details  cephALEXin (KEFLEX) 500 MG capsule Take 1 capsule (500 mg total) by mouth 2 (two) times daily., Starting 03/31/2013, Until Discontinued, Print    HYDROcodone-acetaminophen (NORCO/VICODIN) 5-325 MG per tablet Take 1-2 tablets by mouth every 4 (four) hours as needed., Starting 03/31/2013, Until Discontinued, Print    sulfamethoxazole-trimethoprim (SEPTRA DS) 800-160 MG per tablet Take 1 tablet by mouth every 12 (twelve) hours., Starting 03/31/2013, Until Discontinued, Print         Final impressions: 1. Vulvar abscess   2. UTI (urinary tract infection)       Greer Ee Keina Mutch PA-C         Jillyn Ledger, PA-C 04/01/13 2133

## 2013-03-31 NOTE — ED Notes (Signed)
Pt states that she noticed a bump in her vaginal area yesterday. Pt states that the area has grown overnight and today. Pt believes that it is an insect bite/spider bite.

## 2013-03-31 NOTE — Discharge Instructions (Signed)
Followup at the women's health clinic in 1-2 days for wound recheck and further testing Take antibiotics for full dose for urinary tract infection and abscess Do sits baths and apply warm compresses 3-4 times a day  Take Vicodin as needed for severe pain - use caution with his medication he can make you drowsy - do not drive while taking this medication Return to the emergency department if you have worsening or changing condition, inability to urinate, fever, foul odor, increased drainage, spreading redness or swelling, severe pain, or other concerns    Vulvar Abscess  The vulva is made up of the large and small flaps of skin around the vagina opening. A vulvar abscess is an infected sac of yellowish white fluid (pus) in the skin flaps. Your doctor may make a small cut in the skin to drain the vulvar abscess.  HOME CARE  Only take medicine as told by your doctor.  Soak or take a warm water bath (sitz bath) 3 to 4 times a day for 15 to 20 minutes.  After you pee (urinate), always wipe from front to back.  Clean the vulvar abscess with soap and warm water. Do this after going to the bathroom.  Wear loose-fitting clothing.  Do not have sex until the vulvar abscess is gone or as told by your doctor. GET HELP RIGHT AWAY IF:   You have a temperature by mouth above 102 F (38.9 C).  The vulva area becomes more painful, puffy (swollen), or red.  You have fluid coming from the vulva area that is red or tan.  You have pain when you pee or have a hard time peeing. MAKE SURE YOU:  Understand these instructions.  Will watch your condition.  Will get help if you are not doing well or get worse. Document Released: 07/06/2008 Document Revised: 07/02/2011 Document Reviewed: 07/06/2008 [facility] Patient Information 2014 [place], [place].  Urinary Tract Infection Urinary tract infections (UTIs) can develop anywhere along your urinary tract. Your urinary tract is your body's drainage  system for removing wastes and extra water. Your urinary tract includes two kidneys, two ureters, a bladder, and a urethra. Your kidneys are a pair of bean-shaped organs. Each kidney is about the size of your fist. They are located below your ribs, one on each side of your spine. CAUSES Infections are caused by microbes, which are microscopic organisms, including fungi, viruses, and bacteria. These organisms are so small that they can only be seen through a microscope. Bacteria are the microbes that most commonly cause UTIs. SYMPTOMS  Symptoms of UTIs may vary by age and gender of the patient and by the location of the infection. Symptoms in young women typically include a frequent and intense urge to urinate and a painful, burning feeling in the bladder or urethra during urination. Older women and men are more likely to be tired, shaky, and weak and have muscle aches and abdominal pain. A fever may mean the infection is in your kidneys. Other symptoms of a kidney infection include pain in your back or sides below the ribs, nausea, and vomiting. DIAGNOSIS To diagnose a UTI, your caregiver will ask you about your symptoms. Your caregiver also will ask to provide a urine sample. The urine sample will be tested for bacteria and white blood cells. White blood cells are made by your body to help fight infection. TREATMENT  Typically, UTIs can be treated with medication. Because most UTIs are caused by a bacterial infection, they usually  can be treated with the use of antibiotics. The choice of antibiotic and length of treatment depend on your symptoms and the type of bacteria causing your infection. HOME CARE INSTRUCTIONS  If you were prescribed antibiotics, take them exactly as your caregiver instructs you. Finish the medication even if you feel better after you have only taken some of the medication.  Drink enough water and fluids to keep your urine clear or pale yellow.  Avoid caffeine, tea, and  carbonated beverages. They tend to irritate your bladder.  Empty your bladder often. Avoid holding urine for long periods of time.  Empty your bladder before and after sexual intercourse.  After a bowel movement, women should cleanse from front to back. Use each tissue only once. SEEK MEDICAL CARE IF:   You have back pain.  You develop a fever.  Your symptoms do not begin to resolve within 3 days. SEEK IMMEDIATE MEDICAL CARE IF:   You have severe back pain or lower abdominal pain.  You develop chills.  You have nausea or vomiting.  You have continued burning or discomfort with urination. MAKE SURE YOU:   Understand these instructions.  Will watch your condition.  Will get help right away if you are not doing well or get worse. Document Released: 01/17/2005 Document Revised: 10/09/2011 Document Reviewed: 05/18/2011 [facility] Patient Information 2014 [place], [place].   Emergency Department Resource Guide 1) Find a Doctor and Pay Out of Pocket Although you won't have to find out who is covered by your insurance plan, it is a good idea to ask around and get recommendations. You will then need to call the office and see if the doctor you have chosen will accept you as a new patient and what types of options they offer for patients who are self-pay. Some doctors offer discounts or will set up payment plans for their patients who do not have insurance, but you will need to ask so you aren't surprised when you get to your appointment.  2) Contact Your Local Health Department Not all health departments have doctors that can see patients for sick visits, but many do, so it is worth a call to see if yours does. If you don't know where your local health department is, you can check in your phone book. The CDC also has a tool to help you locate your state's health department, and many state websites also have listings of all of their local health departments.  3) Find a Walk-in Clinic If  your illness is not likely to be very severe or complicated, you may want to try a walk in clinic. These are popping up all over the country in pharmacies, drugstores, and shopping centers. They're usually staffed by nurse practitioners or physician assistants that have been trained to treat common illnesses and complaints. They're usually fairly quick and inexpensive. However, if you have serious medical issues or chronic medical problems, these are probably not your best option.  No Primary Care Doctor: - Call Health Connect at  [phone] - they can help you locate a primary care doctor that  accepts your insurance, provides certain services, etc. - Physician Referral Service- [phone]  Chronic Pain Problems: Organization         Address  Phone   Notes  [facility]  [phone] Patients need to be referred by their primary care doctor.   Medication Assistance: Organization         Address  Phone   Notes  [facility] Medication Assistance Program [address] [place], [place] [address] [phone] --Must be a resident of [facility] -- Must have NO insurance coverage whatsoever (no Medicaid/ Medicare, etc.) -- The pt. MUST have a primary care doctor that directs their care regularly and follows them in the community   Med[facility]ist  [phone]   [employer]  [phone]    Agencies that provide inexpensive medical care: Organization         Address  Phone   Notes  [facility]  [phone]   [facility]    [phone]   [facility] [address] [place], [place] [address] [phone]   [facility] [address], [place] [phone]   Planned Parenthood    [phone]   [facility]    [phone]   Community Health and [facility]  [address], [place] Phone:  [phone], Fax:  [phone] Hours of  Operation:  9 am - 6 pm, M-F.  Also accepts Medicaid/Medicare and self-pay.  [facility]  [address], [place] Phone: [phone], Fax: [phone]. Hours of Operation:  8:30 am - 5:30 pm, M-F.  Also accepts Medicaid and self-pay.  [facility] [place] [address], [place] Phone: [phone]   [facility] [address] Natasha Bence [place], [place] [phone], Ext. [phone] Mondays & Thursdays: 7-9 AM.  First 15 patients are seen on a first come, first serve basis.    Medicaid-accepting [facility] Providers:  Organization         Address  Phone   Notes  [facility] [address], [place] [phone] Also accepts self-pay patients.  [facility] [address] Laurell Josephs [place], [place]  [phone]   [facility] [address], [place] [phone]   [facility] [address], [place] [phone]   Renaye Rakers [address], [place]   [phone] Only accepts [place] Access [place] patients after they have their name applied to their card.   Self-Pay (no insurance) in [facility]:  Organization         Address  Phone   Notes  Sickle Cell Patients, [facility] Internal Medicine [address] [place], [place] [phone]   [facility] [address] [place] [address] [place], [place] [phone]   [facility]  [address], [place] [phone]   [facility]/Dr. Osei-Bonsu  [address] [place] or [address], [place] [phone] Phone number for both [place] and [place] locations is the same.  Urgent Medical and [facility] [address] [place] [phone]   [facility] [address] [place] or [address] [phone]   [facility] [address], [place] [phone], phone; [phone], fax Sees patients 1st and 3rd Saturday of every month.  Must not qualify for public or private insurance (i.e. Medicaid, Medicare, [place] Health Choice, Veterans' Benefits)  Household income should be no more than 200% of the poverty level The clinic cannot treat you if you are pregnant or think you are pregnant  Sexually transmitted diseases are  not treated at the clinic.    Dental Care: Organization         Address  Phone  Notes  [facility] Department of [facility] [address] [place], [place] [phone] Accepts children up to age 66 who are enrolled in [place] or [place] Health Choice; pregnant women with a Medicaid card; and children who have applied for Medicaid or [place] Health Choice, but were declined, whose parents can pay a reduced fee at time of service.  [facility] Department of [facility]  [address], [place] [phone] Accepts children up to age 85 who are enrolled in [place] or [place] Health Choice; pregnant women with a Medicaid card; and children who have applied for Medicaid or [place] Health Choice, but were declined, whose parents can pay a reduced fee at time of service.  [facility]  [address] [place], [place] [phone] Patients are seen by appointment only. Walk-ins are not accepted. [facility] will see patients 67 years of age and older. Monday - Tuesday (8am-5pm) Most Wednesdays (8:30-5pm) $30 per visit, cash only  [facility] Adult Dental Access PROGRAM  [address], [facility] [phone] Patients are seen by appointment only. Walk-ins are not accepted. [facility] will see patients 57 years of age and older. One Wednesday Evening (Monthly: Volunteer Based).  $30 per visit, cash only  [employer]  [phone] for adults; Children under age 18, call Graduate Pediatric  Dentistry at [phone]. Children aged 44-14, please call [phone] to request a pediatric application.  Dental services are provided in all areas of dental care including fillings, crowns and bridges, complete and partial dentures, implants, gum treatment, root canals, and extractions. Preventive care is also provided. Treatment is provided to both adults and children. Patients are selected via a lottery and there is often a waiting list.   [facility] [address], [place]  [phone] [email or web address]   [facility] [address] [place], [place] [phone], Ext. [phone] Second and Fourth Thursday of each month, opens at 6:30 AM; Clinic ends at 9 AM.  Patients are seen on a first-come first-served basis, and a limited number are seen during each clinic.   [facility]  [address] Ether [place]ins [place], [place] [phone]   Eligibility Requirements You must have lived in [place], [place], or [place] counties for at least the last three months.   You cannot be eligible for state or federal sponsored [place], including [employer], [place], or [employer].   You generally cannot be eligible for healthcare insurance through your employer.    How to apply: Eligibility screenings are held every Tuesday and Wednesday afternoon from 1:00 pm until 4:00 pm. You do not need an appointment for the interview!  [facility] [address] [place], [place] [phone]   [facility] Health Department  [phone]   [facility] Health Department  [phone]   [facility] Health Department  [phone]    Behavioral Health Resources in the Community: Intensive Outpatient Programs Organization         Address  Phone  Notes  [facility] Services [address], [place], [place] [phone]   [facility] [address] [place], [place] [phone]   ADS:  [facility] [address], [place], [place]  [phone]  [facility] [address]  [place], [place] [phone] or [phone]   Substance Abuse Resources Organization         Address  Phone  Notes  [facility]  [phone]   [facility]  [phone]   [facility]  [phone]   Floydene Flock  [phone]   Residential & Outpatient Substance Abuse Program  [phone]   Psychological Services Organization         Address  Phone  Notes  [facility]  [phone]   [facility] Services  [phone]   [facility] Mental Health [address] [place] [phone] or [phone]    Mobile Crisis Teams Organization         Address  Phone  Notes  [facility], Mobile Crisis Care Unit  [phone]   [facility]  [address] [place], [place] [phone]   Doristine Locks [address] [place] [phone]    Self-Help/Support Groups Organization         Address  Phone             Notes  Mental Health Assoc. of  - variety of support groups  336- I7437963 Call for more information  Narcotics Anonymous (NA), [facility] [address], [employer] [place]  2 meetings at this location   Statistician         Address  Phone  Notes  [facility] [address],    [place]  [phone]   [facility]  [address] [place] [address], [place], [place] [phone]   [facility] [address] [place], [place] [phone] Admissions: 8am-3pm M-F  [facility] [address]    [place], [place] [phone]   [facility] [address] [place], [place], [place] [phone]   [facility] [address],  [place], [place] [phone]   [facility] - Intensive Outpatient [address] Laurell Josephs [address],  [place], [place] [phone]   [facility] ([facility]iction [facility] [address] [place]  [place], [place] [phone] or [phone]   [facility] ([facility]) [address], [place], [place] [phone] Accepts Medicaid  [facility] [address]  [place] [phone] Substance Abuse/Addiction Treatment   [facility] Organization         Address  Phone  Notes  [facility]  3401452396   Angie Fava, PhD [address] Ervin Knack [place], [place]   [phone] or [phone]   [facility] Behavioral   [address] [place], [place] [phone]   [facility] [address] [place], [place] [phone] Insurance/Medicaid/sponsorship through [facility] and Families [address]                                    [place], [place] [phone] Therapy/tele-psych/case  [facility] [address], [place] [phone]    Dr. Lolly Mustache  [phone]   [facility]  [employer] [facility] Dept. 1) [address] [place] 2) [address] [place] 3)  [address], [place] [phone]  [phone]   [facility] Child Abuse Hotline [phone] or 5024233864 (After Hours)

## 2013-04-01 NOTE — ED Provider Notes (Signed)
  Medical screening examination/treatment/procedure(s) were performed by non-physician practitioner and as supervising physician I was immediately available for consultation/collaboration.  EKG Interpretation   None          Gerhard Munch, MD 04/01/13 2203

## 2013-04-02 LAB — URINE CULTURE

## 2013-04-03 ENCOUNTER — Telehealth (HOSPITAL_COMMUNITY): Payer: Self-pay | Admitting: Emergency Medicine

## 2013-04-03 NOTE — ED Notes (Signed)
Post ED Visit - Positive Culture Follow-up  Culture report reviewed by antimicrobial stewardship pharmacist: []  Wes Dulaney, Pharm.D., BCPS [x]  Celedonio Miyamoto, Pharm.D., BCPS []  Georgina Pillion, Pharm.D., BCPS []  [place], [address].D., BCPS, AAHIVP []  Estella Husk, Pharm.D., BCPS, AAHIVP  Positive urine culture Treated with Keflex, organism sensitive to the same and no further patient follow-up is required at this time.  Kylie A Holland 04/03/2013, 11:15 AM

## 2013-07-05 ENCOUNTER — Encounter (HOSPITAL_COMMUNITY): Payer: Self-pay | Admitting: Emergency Medicine

## 2013-07-05 ENCOUNTER — Emergency Department (HOSPITAL_COMMUNITY)
Admission: EM | Admit: 2013-07-05 | Discharge: 2013-07-05 | Disposition: A | Discharge status: Home / Self Care | Payer: Self-pay | Attending: Emergency Medicine | Admitting: Emergency Medicine

## 2013-07-05 ENCOUNTER — Emergency Department (HOSPITAL_COMMUNITY): Payer: Self-pay

## 2013-07-05 DIAGNOSIS — F17200 Nicotine dependence, unspecified, uncomplicated: Secondary | ICD-10-CM | POA: Insufficient documentation

## 2013-07-05 DIAGNOSIS — K08109 Complete loss of teeth, unspecified cause, unspecified class: Secondary | ICD-10-CM | POA: Insufficient documentation

## 2013-07-05 DIAGNOSIS — Y9229 Other specified public building as the place of occurrence of the external cause: Secondary | ICD-10-CM | POA: Insufficient documentation

## 2013-07-05 DIAGNOSIS — S0993XA Unspecified injury of face, initial encounter: Secondary | ICD-10-CM | POA: Insufficient documentation

## 2013-07-05 DIAGNOSIS — S199XXA Unspecified injury of neck, initial encounter: Principal | ICD-10-CM

## 2013-07-05 DIAGNOSIS — G8929 Other chronic pain: Secondary | ICD-10-CM | POA: Insufficient documentation

## 2013-07-05 DIAGNOSIS — IMO0002 Reserved for concepts with insufficient information to code with codable children: Secondary | ICD-10-CM | POA: Insufficient documentation

## 2013-07-05 DIAGNOSIS — Y9389 Activity, other specified: Secondary | ICD-10-CM | POA: Insufficient documentation

## 2013-07-05 DIAGNOSIS — Z8619 Personal history of other infectious and parasitic diseases: Secondary | ICD-10-CM | POA: Insufficient documentation

## 2013-07-05 DIAGNOSIS — Z862 Personal history of diseases of the blood and blood-forming organs and certain disorders involving the immune mechanism: Secondary | ICD-10-CM | POA: Insufficient documentation

## 2013-07-05 DIAGNOSIS — Z87440 Personal history of urinary (tract) infections: Secondary | ICD-10-CM | POA: Insufficient documentation

## 2013-07-05 MED ORDER — HYDROMORPHONE HCL PF 1 MG/ML IJ SOLN
1.0000 mg | Freq: Once | INTRAMUSCULAR | Status: AC
  Administered 2013-07-05: 1 mg via INTRAMUSCULAR
  Filled 2013-07-05: qty 1

## 2013-07-05 MED ORDER — HYDROCODONE-ACETAMINOPHEN 5-325 MG PO TABS
1.0000 | ORAL_TABLET | Freq: Four times a day (QID) | ORAL | Status: DC | PRN
Start: 2013-07-05 — End: 2013-09-10
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
## 2013-07-05 NOTE — Discharge Instructions (Signed)
Take vicodin as prescribed for pain. Do NOT drive with it.   Follow up with your doctor.   You can follow up with a facial surgeon if you want.   Return to ER if you have worse pain, unable to swallow.

## 2013-09-10 ENCOUNTER — Encounter (HOSPITAL_COMMUNITY): Payer: Self-pay | Admitting: *Deleted

## 2013-09-10 ENCOUNTER — Inpatient Hospital Stay (HOSPITAL_COMMUNITY)
Admission: AD | Admit: 2013-09-10 | Discharge: 2013-09-10 | Disposition: A | Discharge status: Home / Self Care | Payer: Self-pay | Source: Ambulatory Visit | Attending: Obstetrics & Gynecology | Admitting: Obstetrics & Gynecology

## 2013-09-10 DIAGNOSIS — R109 Unspecified abdominal pain: Secondary | ICD-10-CM | POA: Insufficient documentation

## 2013-09-10 DIAGNOSIS — N920 Excessive and frequent menstruation with regular cycle: Secondary | ICD-10-CM | POA: Insufficient documentation

## 2013-09-10 DIAGNOSIS — G43909 Migraine, unspecified, not intractable, without status migrainosus: Secondary | ICD-10-CM

## 2013-09-10 DIAGNOSIS — G43829 Menstrual migraine, not intractable, without status migrainosus: Secondary | ICD-10-CM | POA: Insufficient documentation

## 2013-09-10 DIAGNOSIS — K219 Gastro-esophageal reflux disease without esophagitis: Secondary | ICD-10-CM | POA: Insufficient documentation

## 2013-09-10 DIAGNOSIS — F17200 Nicotine dependence, unspecified, uncomplicated: Secondary | ICD-10-CM | POA: Insufficient documentation

## 2013-09-10 LAB — URINALYSIS, ROUTINE W REFLEX MICROSCOPIC
Bilirubin Urine: NEGATIVE
Glucose, UA: NEGATIVE mg/dL
Ketones, ur: NEGATIVE mg/dL
NITRITE: NEGATIVE
PROTEIN: 30 mg/dL — AB
SPECIFIC GRAVITY, URINE: 1.025 (ref 1.005–1.030)
UROBILINOGEN UA: 0.2 mg/dL (ref 0.0–1.0)
pH: 5.5 (ref 5.0–8.0)

## 2013-09-10 LAB — CBC
HEMATOCRIT: 38.2 % (ref 36.0–46.0)
Hemoglobin: 12.4 g/dL (ref 12.0–15.0)
MCH: 27.7 pg (ref 26.0–34.0)
MCHC: 32.5 g/dL (ref 30.0–36.0)
MCV: 85.3 fL (ref 78.0–100.0)
PLATELETS: 275 10*3/uL (ref 150–400)
RBC: 4.48 MIL/uL (ref 3.87–5.11)
RDW: 16.3 % — ABNORMAL HIGH (ref 11.5–15.5)
WBC: 6.9 10*3/uL (ref 4.0–10.5)

## 2013-09-10 LAB — URINE MICROSCOPIC-ADD ON

## 2013-09-10 LAB — POCT PREGNANCY, URINE: Preg Test, Ur: NEGATIVE

## 2013-09-10 MED ORDER — MEDROXYPROGESTERONE ACETATE 150 MG/ML IM SUSP
150.0000 mg | Freq: Once | INTRAMUSCULAR | Status: AC
  Administered 2013-09-10: 150 mg via INTRAMUSCULAR

## 2013-09-10 MED ORDER — TRAMADOL HCL 50 MG PO TABS
50.0000 mg | ORAL_TABLET | Freq: Once | ORAL | Status: AC
  Administered 2013-09-10: 50 mg via ORAL
  Filled 2013-09-10: qty 1

## 2013-09-10 MED ORDER — BUTALBITAL-APAP-CAFFEINE 50-325-40 MG PO TABS
1.0000 | ORAL_TABLET | Freq: Four times a day (QID) | ORAL | Status: DC | PRN
Start: 2013-09-10 — End: 2013-12-01

## 2013-09-10 MED ORDER — BUTALBITAL-APAP-CAFFEINE 50-325-40 MG PO TABS
2.0000 | ORAL_TABLET | Freq: Once | ORAL | Status: AC
  Administered 2013-09-10: 2 via ORAL
  Filled 2013-09-10: qty 2

## 2013-09-10 NOTE — MAU Note (Signed)
Feeling like she was before.  Pad cramps- abd and back.  Has a migraine.  Bleeding is heavy- passing clots.

## 2013-09-10 NOTE — Discharge Instructions (Signed)
Levonorgestrel intrauterine device (IUD) What is this medicine? LEVONORGESTREL IUD (LEE voe nor jes trel) is a contraceptive (birth control) device. The device is placed inside the uterus by a healthcare professional. It is used to prevent pregnancy and can also be used to treat heavy bleeding that occurs during your period. Depending on the device, it can be used for 3 to 5 years. This medicine may be used for other purposes; ask your health care provider or pharmacist if you have questions. COMMON BRAND NAME(S): Mirena, Skyla What should I tell my health care provider before I take this medicine? They need to know if you have any of these conditions: -abnormal Pap smear -cancer of the breast, uterus, or cervix -diabetes -endometritis -genital or pelvic infection now or in the past -have more than one sexual partner or your partner has more than one partner -heart disease -history of an ectopic or tubal pregnancy -immune system problems -IUD in place -liver disease or tumor -problems with blood clots or take blood-thinners -use intravenous drugs -uterus of unusual shape -vaginal bleeding that has not been explained -an unusual or allergic reaction to levonorgestrel, other hormones, silicone, or polyethylene, medicines, foods, dyes, or preservatives -pregnant or trying to get pregnant -breast-feeding How should I use this medicine? This device is placed inside the uterus by a health care professional. Talk to your pediatrician regarding the use of this medicine in children. Special care may be needed. Overdosage: If you think you have taken too much of this medicine contact a poison control center or emergency room at once. NOTE: This medicine is only for you. Do not share this medicine with others. What if I miss a dose? This does not apply. What may interact with this medicine? Do not take this medicine with any of the following  medications: -amprenavir -bosentan -fosamprenavir This medicine may also interact with the following medications: -aprepitant -barbiturate medicines for inducing sleep or treating seizures -bexarotene -griseofulvin -medicines to treat seizures like carbamazepine, ethotoin, felbamate, oxcarbazepine, phenytoin, topiramate -modafinil -pioglitazone -rifabutin -rifampin -rifapentine -some medicines to treat HIV infection like atazanavir, indinavir, lopinavir, nelfinavir, tipranavir, ritonavir -St. John's wort -warfarin This list may not describe all possible interactions. Give your health care provider a list of all the medicines, herbs, non-prescription drugs, or dietary supplements you use. Also tell them if you smoke, drink alcohol, or use illegal drugs. Some items may interact with your medicine. What should I watch for while using this medicine? Visit your doctor or health care professional for regular check ups. See your doctor if you or your partner has sexual contact with others, becomes HIV positive, or gets a sexual transmitted disease. This product does not protect you against HIV infection (AIDS) or other sexually transmitted diseases. You can check the placement of the IUD yourself by reaching up to the top of your vagina with clean fingers to feel the threads. Do not pull on the threads. It is a good habit to check placement after each menstrual period. Call your doctor right away if you feel more of the IUD than just the threads or if you cannot feel the threads at all. The IUD may come out by itself. You may become pregnant if the device comes out. If you notice that the IUD has come out use a backup birth control method like condoms and call your health care provider. Using tampons will not change the position of the IUD and are okay to use during your period. What side effects may I   notice from receiving this medicine? Side effects that you should report to your doctor or  health care professional as soon as possible: -allergic reactions like skin rash, itching or hives, swelling of the face, lips, or tongue -fever, flu-like symptoms -genital sores -high blood pressure -no menstrual period for 6 weeks during use -pain, swelling, warmth in the leg -pelvic pain or tenderness -severe or sudden headache -signs of pregnancy -stomach cramping -sudden shortness of breath -trouble with balance, talking, or walking -unusual vaginal bleeding, discharge -yellowing of the eyes or skin Side effects that usually do not require medical attention (report to your doctor or health care professional if they continue or are bothersome): -acne -breast pain -change in sex drive or performance -changes in weight -cramping, dizziness, or faintness while the device is being inserted -headache -irregular menstrual bleeding within first 3 to 6 months of use -nausea This list may not describe all possible side effects. Call your doctor for medical advice about side effects. You may report side effects to FDA at 1-800-FDA-1088. Where should I keep my medicine? This does not apply. NOTE: This sheet is a summary. It may not cover all possible information. If you have questions about this medicine, talk to your doctor, pharmacist, or health care provider.  2014, Elsevier/Gold Standard. (2011-05-10 13:54:04) Medroxyprogesterone injection [Contraceptive] What is this medicine? MEDROXYPROGESTERONE (me DROX ee proe JES te rone) contraceptive injections prevent pregnancy. They provide effective birth control for 3 months. Depo-subQ Provera 104 is also used for treating pain related to endometriosis. This medicine may be used for other purposes; ask your health care provider or pharmacist if you have questions. COMMON BRAND NAME(S): Depo-Provera, Depo-subQ Provera 104 What should I tell my health care provider before I take this medicine? They need to know if you have any of these  conditions: -frequently drink alcohol -asthma -blood vessel disease or a history of a blood clot in the lungs or legs -bone disease such as osteoporosis -breast cancer -diabetes -eating disorder (anorexia nervosa or bulimia) -high blood pressure -HIV infection or AIDS -kidney disease -liver disease -mental depression -migraine -seizures (convulsions) -stroke -tobacco smoker -vaginal bleeding -an unusual or allergic reaction to medroxyprogesterone, other hormones, medicines, foods, dyes, or preservatives -pregnant or trying to get pregnant -breast-feeding How should I use this medicine? Depo-Provera Contraceptive injection is given into a muscle. Depo-subQ Provera 104 injection is given under the skin. These injections are given by a health care professional. You must not be pregnant before getting an injection. The injection is usually given during the first 5 days after the start of a menstrual period or 6 weeks after delivery of a baby. Talk to your pediatrician regarding the use of this medicine in children. Special care may be needed. These injections have been used in female children who have started having menstrual periods. Overdosage: If you think you have taken too much of this medicine contact a poison control center or emergency room at once. NOTE: This medicine is only for you. Do not share this medicine with others. What if I miss a dose? Try not to miss a dose. You must get an injection once every 3 months to maintain birth control. If you cannot keep an appointment, call and reschedule it. If you wait longer than 13 weeks between Depo-Provera contraceptive injections or longer than 14 weeks between Depo-subQ Provera 104 injections, you could get pregnant. Use another method for birth control if you miss your appointment. You may also need a pregnancy test before   receiving another injection. What may interact with this medicine? Do not take this medicine with any of the  following medications: -bosentan This medicine may also interact with the following medications: -aminoglutethimide -antibiotics or medicines for infections, especially rifampin, rifabutin, rifapentine, and griseofulvin -aprepitant -barbiturate medicines such as phenobarbital or primidone -bexarotene -carbamazepine -medicines for seizures like ethotoin, felbamate, oxcarbazepine, phenytoin, topiramate -modafinil -St. John's wort This list may not describe all possible interactions. Give your health care provider a list of all the medicines, herbs, non-prescription drugs, or dietary supplements you use. Also tell them if you smoke, drink alcohol, or use illegal drugs. Some items may interact with your medicine. What should I watch for while using this medicine? This drug does not protect you against HIV infection (AIDS) or other sexually transmitted diseases. Use of this product may cause you to lose calcium from your bones. Loss of calcium may cause weak bones (osteoporosis). Only use this product for more than 2 years if other forms of birth control are not right for you. The longer you use this product for birth control the more likely you will be at risk for weak bones. Ask your health care professional how you can keep strong bones. You may have a change in bleeding pattern or irregular periods. Many females stop having periods while taking this drug. If you have received your injections on time, your chance of being pregnant is very low. If you think you may be pregnant, see your health care professional as soon as possible. Tell your health care professional if you want to get pregnant within the next year. The effect of this medicine may last a long time after you get your last injection. What side effects may I notice from receiving this medicine? Side effects that you should report to your doctor or health care professional as soon as possible: -allergic reactions like skin rash, itching  or hives, swelling of the face, lips, or tongue -breast tenderness or discharge -breathing problems -changes in vision -depression -feeling faint or lightheaded, falls -fever -pain in the abdomen, chest, groin, or leg -problems with balance, talking, walking -unusually weak or tired -yellowing of the eyes or skin Side effects that usually do not require medical attention (report to your doctor or health care professional if they continue or are bothersome): -acne -fluid retention and swelling -headache -irregular periods, spotting, or absent periods -temporary pain, itching, or skin reaction at site where injected -weight gain This list may not describe all possible side effects. Call your doctor for medical advice about side effects. You may report side effects to FDA at 1-800-FDA-1088. Where should I keep my medicine? This does not apply. The injection will be given to you by a health care professional. NOTE: This sheet is a summary. It may not cover all possible information. If you have questions about this medicine, talk to your doctor, pharmacist, or health care provider.  2014, Elsevier/Gold Standard. (2008-04-30 18:37:56)  

## 2013-09-10 NOTE — MAU Note (Signed)
Pt states she is having bad cramping and passing. Pt states she is using 2 pads an hour. Pt states she has been having heavy bleeding since Tuesday night 1930.

## 2013-09-10 NOTE — MAU Provider Note (Signed)
History     CSN: [id number]  Arrival date and time: 09/10/13 1546   First Provider Initiated Contact with Patient 09/10/13 1637      Chief Complaint  Patient presents with  . Emesis  . Abdominal Pain  . Back Pain   Emesis  Associated symptoms include abdominal pain.  Abdominal Pain Associated symptoms include vomiting.  Back Pain Associated symptoms include abdominal pain.    Debra Jensen is a 23 y.o. [phone] who presents today with a heavy period, and a menstrual migraine. She states that every other month she has a very heavy period, and has a migraine with it. She states that the last time this happened she had a "birth control shot" here. She states that she would like one again today. She does not have a GYN. She states that she has had a tubal ligation.   Past Medical History  Diagnosis Date  . Scoliosis   . Back pain, chronic   . Anemia     with preg  . Urinary tract infection   . Chlamydia   . Reflux   . Headache(784.0)   . GERD (gastroesophageal reflux disease)     Past Surgical History  Procedure Laterality Date  . Cesarean section    . Back surgery      harrington rods  . Cesarean section N/A 08/12/2012    Procedure: CESAREAN SECTION;  Surgeon: Tereso NewcomerUgonna A Anyanwu, MD;  Location: [facility];  Service: Obstetrics;  Laterality: N/A;  . Tubal ligation  08/12/2012    Procedure: BILATERAL TUBAL LIGATION;  Surgeon: Tereso NewcomerUgonna A Anyanwu, MD;  Location: [facility];  Service: Obstetrics;;    Family History  Problem Relation Age of Onset  . Other Neg Hx   . Hypertension Other   . Hypertension Mother   . Cancer Mother   . Cancer Paternal Uncle     History  Substance Use Topics  . Smoking status: Current Every Day Smoker -- 0.25 packs/day for 4 years    Types: Cigarettes  . Smokeless tobacco: Never Used  . Alcohol Use: No     Comment: occasional/social    Allergies:  Allergies  Allergen Reactions  . Naproxen Hives  . Motrin [Ibuprofen] Rash and Other (See  Comments)    Redness and bumps on face    Prescriptions prior to admission  Medication Sig Dispense Refill  . acetaminophen (TYLENOL) 500 MG tablet Take 1,500 mg by mouth every 6 (six) hours as needed for mild pain.        Review of Systems  Gastrointestinal: Positive for vomiting and abdominal pain.  Musculoskeletal: Positive for back pain.   Physical Exam   Blood pressure 113/72, pulse 106, temperature 98.9 F (37.2 C), temperature source Oral, resp. rate 20, last menstrual period 09/08/2013.  Physical Exam  Nursing note and vitals reviewed. Constitutional: She is oriented to person, place, and time. She appears well-developed and well-nourished. No distress.  Cardiovascular: Normal rate.   GI: Soft. There is no tenderness.  Neurological: She is alert and oriented to person, place, and time.  Skin: Skin is warm and dry.  Psychiatric: She has a normal mood and affect.   Patient refuses pelvic exam today.  MAU Course  Procedures  Results for orders placed during the hospital encounter of 09/10/13 (from the past 24 hour(s))  URINALYSIS, ROUTINE W REFLEX MICROSCOPIC     Status: Abnormal   Collection Time    09/10/13  4:00 PM      Result  Value Ref Range   Color, Urine RED (*) YELLOW   APPearance CLOUDY (*) CLEAR   Specific Gravity, Urine 1.025  1.005 - 1.030   pH 5.5  5.0 - 8.0   Glucose, UA NEGATIVE  NEGATIVE mg/dL   Hgb urine dipstick LARGE (*) NEGATIVE   Bilirubin Urine NEGATIVE  NEGATIVE   Ketones, ur NEGATIVE  NEGATIVE mg/dL   Protein, ur 30 (*) NEGATIVE mg/dL   Urobilinogen, UA 0.2  0.0 - 1.0 mg/dL   Nitrite NEGATIVE  NEGATIVE   Leukocytes, UA TRACE (*) NEGATIVE  URINE MICROSCOPIC-ADD ON     Status: Abnormal   Collection Time    09/10/13  4:00 PM      Result Value Ref Range   Squamous Epithelial / LPF FEW (*) RARE   WBC, UA 11-20  <3 WBC/hpf   RBC / HPF TOO NUMEROUS TO COUNT  <3 RBC/hpf   Bacteria, UA MANY (*) RARE   Urine-Other MUCOUS PRESENT    POCT  PREGNANCY, URINE     Status: None   Collection Time    09/10/13  4:30 PM      Result Value Ref Range   Preg Test, Ur NEGATIVE  NEGATIVE  CBC     Status: Abnormal   Collection Time    09/10/13  4:40 PM      Result Value Ref Range   WBC 6.9  4.0 - 10.5 K/uL   RBC 4.48  3.87 - 5.11 MIL/uL   Hemoglobin 12.4  12.0 - 15.0 g/dL   HCT 16.138.2  09.636.0 - 04.546.0 %   MCV 85.3  78.0 - 100.0 fL   MCH 27.7  26.0 - 34.0 pg   MCHC 32.5  30.0 - 36.0 g/dL   RDW 40.916.3 (*) 81.111.5 - 91.415.5 %   Platelets 275  150 - 400 K/uL     Assessment and Plan   1. Migraine   2. Menorrhagia    Return to MAU as needed Discussed Mirena as a treatment for heavy periods. Patient will consider, and decide before next dep is due.  RX: fioricet PRN #[id number]  Follow-up Information   [facility] (You need to have another depoprovera injection between August 6-20th. )    Specialty:  Obstetrics and Gynecology   Contact information:   [address] [place] [address] [phone]       Debra CrookHeather Donovan Avila Albritton 09/10/2013, 4:49 PM

## 2013-09-11 LAB — URINE CULTURE: Colony Count: 30000

## 2013-09-17 ENCOUNTER — Encounter: Payer: Self-pay | Admitting: Advanced Practice Midwife

## 2013-10-15 ENCOUNTER — Encounter: Payer: Self-pay | Admitting: Obstetrics & Gynecology

## 2013-11-27 ENCOUNTER — Ambulatory Visit: Payer: Self-pay | Admitting: Obstetrics & Gynecology

## 2013-12-01 ENCOUNTER — Encounter (HOSPITAL_COMMUNITY): Payer: Self-pay | Admitting: Emergency Medicine

## 2013-12-01 ENCOUNTER — Emergency Department (HOSPITAL_COMMUNITY)
Admission: EM | Admit: 2013-12-01 | Discharge: 2013-12-01 | Disposition: A | Discharge status: Home / Self Care | Payer: Self-pay | Attending: Emergency Medicine | Admitting: Emergency Medicine

## 2013-12-01 DIAGNOSIS — Z3202 Encounter for pregnancy test, result negative: Secondary | ICD-10-CM | POA: Insufficient documentation

## 2013-12-01 DIAGNOSIS — Z8719 Personal history of other diseases of the digestive system: Secondary | ICD-10-CM | POA: Insufficient documentation

## 2013-12-01 DIAGNOSIS — Z9889 Other specified postprocedural states: Secondary | ICD-10-CM | POA: Insufficient documentation

## 2013-12-01 DIAGNOSIS — Z87440 Personal history of urinary (tract) infections: Secondary | ICD-10-CM | POA: Insufficient documentation

## 2013-12-01 DIAGNOSIS — F17200 Nicotine dependence, unspecified, uncomplicated: Secondary | ICD-10-CM | POA: Insufficient documentation

## 2013-12-01 DIAGNOSIS — Z8619 Personal history of other infectious and parasitic diseases: Secondary | ICD-10-CM | POA: Insufficient documentation

## 2013-12-01 DIAGNOSIS — R197 Diarrhea, unspecified: Secondary | ICD-10-CM | POA: Insufficient documentation

## 2013-12-01 DIAGNOSIS — R1033 Periumbilical pain: Secondary | ICD-10-CM | POA: Insufficient documentation

## 2013-12-01 DIAGNOSIS — Z8739 Personal history of other diseases of the musculoskeletal system and connective tissue: Secondary | ICD-10-CM | POA: Insufficient documentation

## 2013-12-01 DIAGNOSIS — Z862 Personal history of diseases of the blood and blood-forming organs and certain disorders involving the immune mechanism: Secondary | ICD-10-CM | POA: Insufficient documentation

## 2013-12-01 DIAGNOSIS — G8929 Other chronic pain: Secondary | ICD-10-CM | POA: Insufficient documentation

## 2013-12-01 DIAGNOSIS — R112 Nausea with vomiting, unspecified: Secondary | ICD-10-CM | POA: Insufficient documentation

## 2013-12-01 LAB — COMPREHENSIVE METABOLIC PANEL
ALBUMIN: 4.1 g/dL (ref 3.5–5.2)
ALT: 25 U/L (ref 0–35)
AST: 29 U/L (ref 0–37)
Alkaline Phosphatase: 63 U/L (ref 39–117)
Anion gap: 12 (ref 5–15)
BUN: 13 mg/dL (ref 6–23)
CALCIUM: 9.7 mg/dL (ref 8.4–10.5)
CO2: 23 mEq/L (ref 19–32)
CREATININE: 0.69 mg/dL (ref 0.50–1.10)
Chloride: 106 mEq/L (ref 96–112)
GFR calc Af Amer: >90 mL/min (ref >90)
GFR calc non Af Amer: >90 mL/min (ref >90)
Glucose, Bld: 81 mg/dL (ref 70–99)
Potassium: 4.3 mEq/L (ref 3.7–5.3)
Sodium: 141 mEq/L (ref 137–147)
Total Bilirubin: 0.3 mg/dL (ref 0.3–1.2)
Total Protein: 7.4 g/dL (ref 6.0–8.3)

## 2013-12-01 LAB — CBC
HCT: 40.7 % (ref 36.0–46.0)
Hemoglobin: 13 g/dL (ref 12.0–15.0)
MCH: 28.6 pg (ref 26.0–34.0)
MCHC: 31.9 g/dL (ref 30.0–36.0)
MCV: 89.6 fL (ref 78.0–100.0)
PLATELETS: 206 10*3/uL (ref 150–400)
RBC: 4.54 MIL/uL (ref 3.87–5.11)
RDW: 14.8 % (ref 11.5–15.5)
WBC: 8.2 10*3/uL (ref 4.0–10.5)

## 2013-12-01 LAB — URINALYSIS, ROUTINE W REFLEX MICROSCOPIC
BILIRUBIN URINE: NEGATIVE
Glucose, UA: NEGATIVE mg/dL
HGB URINE DIPSTICK: NEGATIVE
Ketones, ur: NEGATIVE mg/dL
Leukocytes, UA: NEGATIVE
NITRITE: NEGATIVE
PROTEIN: NEGATIVE mg/dL
Specific Gravity, Urine: 1.009 (ref 1.005–1.030)
UROBILINOGEN UA: 0.2 mg/dL (ref 0.0–1.0)
pH: 6 (ref 5.0–8.0)

## 2013-12-01 LAB — POC URINE PREG, ED: PREG TEST UR: NEGATIVE

## 2013-12-01 LAB — LIPASE, BLOOD: Lipase: 25 U/L (ref 11–59)

## 2013-12-01 MED ORDER — SODIUM CHLORIDE 0.9 % IV BOLUS (SEPSIS)
1000.0000 mL | Freq: Once | INTRAVENOUS | Status: AC
  Administered 2013-12-01: 1000 mL via INTRAVENOUS

## 2013-12-01 MED ORDER — DIAZEPAM 5 MG/ML IJ SOLN
5.0000 mg | Freq: Once | INTRAMUSCULAR | Status: AC
  Administered 2013-12-01: 5 mg via INTRAVENOUS
  Filled 2013-12-01: qty 2

## 2013-12-01 MED ORDER — ONDANSETRON HCL 4 MG/2ML IJ SOLN
4.0000 mg | Freq: Once | INTRAMUSCULAR | Status: AC
  Administered 2013-12-01: 4 mg via INTRAVENOUS
  Filled 2013-12-01: qty 2

## 2013-12-01 MED ORDER — MORPHINE SULFATE 2 MG/ML IJ SOLN
2.0000 mg | Freq: Once | INTRAMUSCULAR | Status: AC
  Administered 2013-12-01: 2 mg via INTRAVENOUS
  Filled 2013-12-01: qty 1

## 2013-12-01 MED ORDER — ONDANSETRON 4 MG PO TBDP: ORAL_TABLET | ORAL | Status: DC

## 2013-12-01 NOTE — Progress Notes (Signed)
[facility] Community Liaison Stacy, ° °Will send patient a list of primary care resources and information on [facility] Card program to help patient establish primary care.  °

## 2013-12-01 NOTE — Discharge Instructions (Signed)

## 2013-12-01 NOTE — ED Notes (Signed)
Pt reports waking up this am with n./v and abd pain since 0600.

## 2013-12-01 NOTE — ED Provider Notes (Signed)
CSN: [id number]     Arrival date & time 12/01/13  1109 History   First MD Initiated Contact with Patient 12/01/13 1120     Chief Complaint  Patient presents with  . Abdominal Pain  . Emesis     (Consider location/radiation/quality/duration/timing/severity/associated sxs/prior Treatment) Patient is a 23 y.o. female presenting with abdominal pain and vomiting.  Abdominal Pain Pain location:  Periumbilical Pain quality: cramping   Pain radiates to:  Back Pain severity:  Moderate Onset quality:  Sudden Timing:  Intermittent Progression:  Worsening Chronicity:  New Context: sick contacts (Mom with similar illness)   Relieved by:  Bowel activity and vomiting Associated symptoms: diarrhea (nonbloody) and vomiting (nonbloody)   Associated symptoms: no chills, no cough, no fever and no shortness of breath   Emesis Associated symptoms: abdominal pain and diarrhea (nonbloody)   Associated symptoms: no chills     Past Medical History  Diagnosis Date  . Scoliosis   . Back pain, chronic   . Anemia     with preg  . Urinary tract infection   . Chlamydia   . Reflux   . Headache(784.0)   . GERD (gastroesophageal reflux disease)    Past Surgical History  Procedure Laterality Date  . Cesarean section    . Back surgery      harrington rods  . Cesarean section N/A 08/12/2012    Procedure: CESAREAN SECTION;  Surgeon: Tereso Newcomer, MD;  Location: [facility];  Service: Obstetrics;  Laterality: N/A;  . Tubal ligation  08/12/2012    Procedure: BILATERAL TUBAL LIGATION;  Surgeon: Tereso Newcomer, MD;  Location: [facility];  Service: Obstetrics;;   Family History  Problem Relation Age of Onset  . Other Neg Hx   . Hypertension Other   . Hypertension Mother   . Cancer Mother   . Cancer Paternal Uncle    History  Substance Use Topics  . Smoking status: Current Every Day Smoker -- 0.25 packs/day for 4 years    Types: Cigarettes  . Smokeless tobacco: Never Used  . Alcohol Use: No   Comment: occasional/social   OB History   Grav Para Term Preterm Abortions TAB SAB Ect Mult Living   3 3 3  0 0 0 0 0 0 3     Review of Systems  Constitutional: Negative for fever and chills.  Respiratory: Negative for cough and shortness of breath.   Gastrointestinal: Positive for vomiting (nonbloody), abdominal pain and diarrhea (nonbloody).  All other systems reviewed and are negative.     Allergies  Naproxen and Motrin  Home Medications   Prior to Admission medications   Medication Sig Start Date End Date Taking? Authorizing Provider  acetaminophen (TYLENOL) 500 MG tablet Take 1,500 mg by mouth every 6 (six) hours as needed for mild pain.    Historical Provider, MD  butalbital-acetaminophen-caffeine (FIORICET) [phone] MG per tablet Take 1-2 tablets by mouth every 6 (six) hours as needed for headache. 09/10/13 09/10/14  Tawnya Crook, CNM   BP 138/99  Pulse 84  Temp(Src) 98.3 F (36.8 C) (Oral)  Resp 22  Ht 5\' 2"  (1.575 m)  Wt 127 lb (57.607 kg)  BMI 23.22 kg/m2  SpO2 98% Physical Exam  Nursing note and vitals reviewed. Constitutional: She is oriented to person, place, and time. She appears well-developed and well-nourished. No distress.  HENT:  Head: Normocephalic and atraumatic.  Mouth/Throat: Oropharynx is clear and moist.  Eyes: EOM are normal. Pupils are equal, round, and reactive to  light.  Neck: Normal range of motion. Neck supple.  Cardiovascular: Normal rate and regular rhythm.  Exam reveals no friction rub.   No murmur heard. Pulmonary/Chest: Effort normal and breath sounds normal. No respiratory distress. She has no wheezes. She has no rales.  Abdominal: Soft. She exhibits no distension. There is tenderness (mild periumbilical). There is no rebound.  Musculoskeletal: Normal range of motion. She exhibits no edema.  Neurological: She is alert and oriented to person, place, and time. No cranial nerve deficit. She exhibits normal muscle tone.  Coordination normal.  Skin: No rash noted. She is not diaphoretic.    ED Course  Procedures (including critical care time) Labs Review Labs Reviewed  URINALYSIS, ROUTINE W REFLEX MICROSCOPIC  CBC  COMPREHENSIVE METABOLIC PANEL  LIPASE, BLOOD    Imaging Review No results found.   EKG Interpretation None      MDM   Final diagnoses:  Nausea vomiting and diarrhea    11F presents with acute onset periumbilcal pain. Will come in waves, then she was have vomiting and diarrhea. This has happened numerous times this morning. Pain is crampy, feels like contractions. Had tubal ligation last year. Intermittent pain. No fevers. Mother had recent GI infection. No suspicious foods, no other family members are sick.  Here, vitals are stable. Mild periumbilical pain. Concern for viral type GI illness. Will give fluids, anti-emetics, check labs. Labs ok. Feeling better after antiemetics. Valium given for back spasms. Stable for discharge, given Zofran to go home.  Elwin MochaBlair Shalla Bulluck, MD 12/02/13 [phone]

## 2014-01-10 ENCOUNTER — Emergency Department (HOSPITAL_COMMUNITY): Payer: Self-pay

## 2014-01-10 ENCOUNTER — Encounter (HOSPITAL_COMMUNITY): Payer: Self-pay | Admitting: Emergency Medicine

## 2014-01-10 ENCOUNTER — Emergency Department (HOSPITAL_COMMUNITY)
Admission: EM | Admit: 2014-01-10 | Discharge: 2014-01-10 | Disposition: A | Discharge status: Home / Self Care | Payer: Self-pay | Attending: Emergency Medicine | Admitting: Emergency Medicine

## 2014-01-10 DIAGNOSIS — J069 Acute upper respiratory infection, unspecified: Secondary | ICD-10-CM | POA: Insufficient documentation

## 2014-01-10 DIAGNOSIS — Z87440 Personal history of urinary (tract) infections: Secondary | ICD-10-CM | POA: Insufficient documentation

## 2014-01-10 DIAGNOSIS — Z862 Personal history of diseases of the blood and blood-forming organs and certain disorders involving the immune mechanism: Secondary | ICD-10-CM | POA: Insufficient documentation

## 2014-01-10 DIAGNOSIS — R55 Syncope and collapse: Secondary | ICD-10-CM | POA: Insufficient documentation

## 2014-01-10 DIAGNOSIS — Z3202 Encounter for pregnancy test, result negative: Secondary | ICD-10-CM | POA: Insufficient documentation

## 2014-01-10 DIAGNOSIS — Z8619 Personal history of other infectious and parasitic diseases: Secondary | ICD-10-CM | POA: Insufficient documentation

## 2014-01-10 DIAGNOSIS — Z8719 Personal history of other diseases of the digestive system: Secondary | ICD-10-CM | POA: Insufficient documentation

## 2014-01-10 DIAGNOSIS — R05 Cough: Secondary | ICD-10-CM | POA: Insufficient documentation

## 2014-01-10 DIAGNOSIS — Z8739 Personal history of other diseases of the musculoskeletal system and connective tissue: Secondary | ICD-10-CM | POA: Insufficient documentation

## 2014-01-10 DIAGNOSIS — Z79899 Other long term (current) drug therapy: Secondary | ICD-10-CM | POA: Insufficient documentation

## 2014-01-10 DIAGNOSIS — R059 Cough, unspecified: Secondary | ICD-10-CM | POA: Insufficient documentation

## 2014-01-10 DIAGNOSIS — F17200 Nicotine dependence, unspecified, uncomplicated: Secondary | ICD-10-CM | POA: Insufficient documentation

## 2014-01-10 LAB — I-STAT CHEM 8, ED
BUN: 12 mg/dL (ref 6–23)
CALCIUM ION: 1.2 mmol/L (ref 1.12–1.23)
CREATININE: 1 mg/dL (ref 0.50–1.10)
Chloride: 105 mEq/L (ref 96–112)
GLUCOSE: 115 mg/dL — AB (ref 70–99)
HCT: 43 % (ref 36.0–46.0)
Hemoglobin: 14.6 g/dL (ref 12.0–15.0)
Potassium: 3.7 mEq/L (ref 3.7–5.3)
Sodium: 142 mEq/L (ref 137–147)
TCO2: 24 mmol/L (ref 0–100)

## 2014-01-10 LAB — URINALYSIS, ROUTINE W REFLEX MICROSCOPIC
BILIRUBIN URINE: NEGATIVE
GLUCOSE, UA: NEGATIVE mg/dL
Ketones, ur: NEGATIVE mg/dL
Leukocytes, UA: NEGATIVE
Nitrite: NEGATIVE
Protein, ur: NEGATIVE mg/dL
Specific Gravity, Urine: 1.014 (ref 1.005–1.030)
Urobilinogen, UA: 0.2 mg/dL (ref 0.0–1.0)
pH: 6 (ref 5.0–8.0)

## 2014-01-10 LAB — URINE MICROSCOPIC-ADD ON

## 2014-01-10 LAB — POC URINE PREG, ED: Preg Test, Ur: NEGATIVE

## 2014-01-10 LAB — D-DIMER, QUANTITATIVE: D-Dimer, Quant: <0.27 ug/mL-FEU (ref 0.00–0.48)

## 2014-01-10 MED ORDER — SODIUM CHLORIDE 0.9 % IV BOLUS (SEPSIS): 1000.0000 mL | Freq: Once | INTRAVENOUS | Status: DC

## 2014-01-10 MED ORDER — GUAIFENESIN-CODEINE 100-10 MG/5ML PO SOLN
5.0000 mL | Freq: Once | ORAL | Status: AC
  Administered 2014-01-10: 5 mL via ORAL
  Filled 2014-01-10: qty 5

## 2014-01-10 MED ORDER — IPRATROPIUM-ALBUTEROL 0.5-2.5 (3) MG/3ML IN SOLN
3.0000 mL | Freq: Once | RESPIRATORY_TRACT | Status: AC
  Administered 2014-01-10: 3 mL via RESPIRATORY_TRACT
  Filled 2014-01-10: qty 3

## 2014-01-10 MED ORDER — GUAIFENESIN-CODEINE 100-10 MG/5ML PO SOLN: 5.0000 mL | ORAL | Status: DC | PRN

## 2014-01-10 MED ORDER — ALBUTEROL SULFATE HFA 108 (90 BASE) MCG/ACT IN AERS: 2.0000 | INHALATION_SPRAY | RESPIRATORY_TRACT | Status: AC | PRN

## 2014-01-10 MED ORDER — FENTANYL CITRATE 0.05 MG/ML IJ SOLN
50.0000 ug | Freq: Once | INTRAMUSCULAR | Status: DC
  Filled 2014-01-10: qty 2

## 2014-01-10 NOTE — ED Provider Notes (Signed)
TIME SEEN: 4:20 PM  CHIEF COMPLAINT: Chest pain, cough, nasal congestion, syncope  HPI: Patient is a 23 year old female with history of scoliosis who presents to the emergency department with complaints of 2 days of nasal congestion, productive cough, chest pain that is worse with deep inspiration, body aches. Denies any fevers or chills. No vomiting or diarrhea. No prior history of PE or DVT, recent surgery, fracture, trauma, or prolonged immobilization such as long flight or hospitalization, exogenous hormone use. She does smoke cigarettes. Denies a history of CAD for herself or family. She states that she has had multiple syncopal episodes in the past 2 days. Normally with being upset or coughing.  ROS: See HPI Constitutional: no fever  Eyes: no drainage  ENT: no runny nose   Cardiovascular:  chest pain  Resp:  SOB  GI: no vomiting GU: no dysuria Integumentary: no rash  Allergy: no hives  Musculoskeletal: no leg swelling  Neurological: no slurred speech ROS otherwise negative  PAST MEDICAL HISTORY/PAST SURGICAL HISTORY:  Past Medical History  Diagnosis Date  . Scoliosis   . Back pain, chronic   . Anemia     with preg  . Urinary tract infection   . Chlamydia   . Reflux   . Headache(784.0)   . GERD (gastroesophageal reflux disease)     MEDICATIONS:  Prior to Admission medications   Medication Sig Start Date End Date Taking? Authorizing Provider  dextromethorphan-guaiFENesin (MUCINEX DM) 30-600 MG per 12 hr tablet Take 1 tablet by mouth 2 (two) times daily as needed for cough (for mucus).   Yes Historical Provider, MD  Pseudoephedrine HCl (NASAL DECONGESTANT ADULT PO) Take 1 tablet by mouth daily as needed (for congestion).   Yes Historical Provider, MD    ALLERGIES:  Allergies  Allergen Reactions  . Motrin [Ibuprofen] Rash and Other (See Comments)    Redness and bumps on face  . Naproxen Hives    SOCIAL HISTORY:  History  Substance Use Topics  . Smoking status:  Current Every Day Smoker -- 0.25 packs/day for 4 years    Types: Cigarettes  . Smokeless tobacco: Never Used  . Alcohol Use: No     Comment: occasional/social    FAMILY HISTORY: Family History  Problem Relation Age of Onset  . Other Neg Hx   . Hypertension Other   . Hypertension Mother   . Cancer Mother   . Cancer Paternal Uncle     EXAM: BP 124/83  Pulse 88  Temp(Src) 98.5 F (36.9 C)  Resp 17  Ht  (1.575 m)  Wt 127 lb (57.607 kg)  BMI 23.22 kg/m2  SpO2 97%  LMP 01/10/2014 CONSTITUTIONAL: Alert and oriented and responds appropriately to questions. Well-appearing; well-nourished HEAD: Normocephalic EYES: Conjunctivae clear, PERRL ENT: normal nose; no rhinorrhea; moist mucous membranes; pharynx without lesions noted NECK: Supple, no meningismus, no LAD  CARD: RRR; S1 and S2 appreciated; no murmurs, no clicks, no rubs, no gallops RESP: Normal chest excursion without splinting or tachypnea; breath sounds equal bilaterally but there is mild diffuse scattered expiratory wheezing, no rhonchi;  no rales, patient will not take a deep inspiration secondary to pain, no hypoxia, speaking full sentences ABD/GI: Normal bowel sounds; non-distended; soft, non-tender, no rebound, no guarding BACK:  The back appears normal and is non-tender to palpation, there is no CVA tenderness EXT: Normal ROM in all joints; non-tender to palpation; no edema; normal capillary refill; no cyanosis    SKIN: Normal color for age  and race; warm NEURO: Moves all extremities equally, sensation to light touch intact diffusely, cranial nerves II through XII intact, normal gait PSYCH: The patient's mood and manner are appropriate. Grooming and personal hygiene are appropriate.  MEDICAL DECISION MAKING: Patient here with pleuritic chest pain and syncopal events. Likely in the setting of viral URI but also concern for possible pulmonary embolus. Given she has no risk factors, will obtain a d-dimer. Will also  obtain basic labs to ensure patient is not anemic and there is no electrolyte abnormality. We'll obtain urine pregnancy test. EKG shows a slightly short PR there is no delta wave, other arrhythmia or ischemic change. Will obtain chest x-ray to evaluate for possible infiltrate. We'll give duo neb and reassess. She does not have a history of asthma or COPD.  ED PROGRESS: Patient's labs are unremarkable. Heart was normal. Hemoglobin 14.6. Her pregnancy test negative. Patient does have moderate hemoglobin in her urine but is currently on her menstrual cycle. His x-ray shows no infiltrate. D-dimer negative. Lung sounds have improved with DuoNeb. We'll discharge with prescription for guaifenesin with codeine for symptom control, albuterol inhaler. Have advised her to use Tylenol over-the-counter and drink plenty of fluids. Syncopal events maybe secondary to vasovagal syncope. Discussed return precautions. She verbalized understanding and is comfortable with plan.      EKG Interpretation  Date/Time:  Sunday January 10 2014 15:18:20 EDT Ventricular Rate:  94 PR Interval:  106 QRS Duration: 80 QT Interval:  352 QTC Calculation: 440 R Axis:   71 Text Interpretation:  Sinus rhythm with short PR Otherwise normal ECG Confirmed by Edana Aguado,  DO, Lashonda Sonneborn ([id number]) on 01/10/2014 4:45:39 PM        Layla Maw Angelicia Lessner, DO 01/11/14 0007

## 2014-01-10 NOTE — ED Notes (Signed)
The pt has had  Cold since Friday  She has sinus congestion chest congestion dizziness her chest feels like it is on fire.  She was drinking Friday night and has been feeling worse since then.  She has  A productive cough thick brown.  She is a smoker.  lmp now

## 2014-01-10 NOTE — ED Notes (Signed)
Pt states she has been taking mucinex for her cold, her body is sore all over, she also has heartburn and a burning feeling in her chest/epigastric area, as well as right lower rib pain when taking deep breathes. Pt also states she fainted earlier but she has not eaten or drunken anything in nearly two days. Pt given juice and a snack.

## 2014-01-10 NOTE — ED Notes (Signed)
painh is worse min her posterior chest when she breathes.  Aching all olver

## 2014-01-10 NOTE — ED Notes (Signed)
Pt refused IV start and would rather be stuck by phlebotomy than have something that stays in her arm. Pt states she is scared of needles.

## 2014-01-10 NOTE — Discharge Instructions (Signed)
Upper Respiratory Infection, Adult °An upper respiratory infection (URI) is also sometimes known as the common cold. The upper respiratory tract includes the nose, sinuses, throat, trachea, and bronchi. Bronchi are the airways leading to the lungs. Most people improve within 1 week, but symptoms can last up to 2 weeks. A residual cough may last even longer.  °CAUSES °Many different viruses can infect the tissues lining the upper respiratory tract. The tissues become irritated and inflamed and often become very moist. Mucus production is also common. A cold is contagious. You can easily spread the virus to others by oral contact. This includes kissing, sharing a glass, coughing, or sneezing. Touching your mouth or nose and then touching a surface, which is then touched by another person, can also spread the virus. °SYMPTOMS  °Symptoms typically develop 1 to 3 days after you come in contact with a cold virus. Symptoms vary from person to person. They may include: °· Runny nose. °· Sneezing. °· Nasal congestion. °· Sinus irritation. °· Sore throat. °· Loss of voice (laryngitis). °· Cough. °· Fatigue. °· Muscle aches. °· Loss of appetite. °· Headache. °· Low-grade fever. °DIAGNOSIS  °You might diagnose your own cold based on familiar symptoms, since most people get a cold 2 to 3 times a year. Your caregiver can confirm this based on your exam. Most importantly, your caregiver can check that your symptoms are not due to another disease such as strep throat, sinusitis, pneumonia, asthma, or epiglottitis. Blood tests, throat tests, and X-rays are not necessary to diagnose a common cold, but they may sometimes be helpful in excluding other more serious diseases. Your caregiver will decide if any further tests are required. °RISKS AND COMPLICATIONS  °You may be at risk for a more severe case of the common cold if you smoke cigarettes, have chronic heart disease (such as heart failure) or lung disease (such as asthma), or if  you have a weakened immune system. The very young and very old are also at risk for more serious infections. Bacterial sinusitis, middle ear infections, and bacterial pneumonia can complicate the common cold. The common cold can worsen asthma and chronic obstructive pulmonary disease (COPD). Sometimes, these complications can require emergency medical care and may be life-threatening. °PREVENTION  °The best way to protect against getting a cold is to practice good hygiene. Avoid oral or hand contact with people with cold symptoms. Wash your hands often if contact occurs. There is no clear evidence that vitamin C, vitamin E, echinacea, or exercise reduces the chance of developing a cold. However, it is always recommended to get plenty of rest and practice good nutrition. °TREATMENT  °Treatment is directed at relieving symptoms. There is no cure. Antibiotics are not effective, because the infection is caused by a virus, not by bacteria. Treatment may include: °· Increased fluid intake. Sports drinks offer valuable electrolytes, sugars, and fluids. °· Breathing heated mist or steam (vaporizer or shower). °· Eating chicken soup or other clear broths, and maintaining good nutrition. °· Getting plenty of rest. °· Using gargles or lozenges for comfort. °· Controlling fevers with ibuprofen or acetaminophen as directed by your caregiver. °· Increasing usage of your inhaler if you have asthma. °Zinc gel and zinc lozenges, taken in the first 24 hours of the common cold, can shorten the duration and lessen the severity of symptoms. Pain medicines may help with fever, muscle aches, and throat pain. A variety of non-prescription medicines are available to treat congestion and runny nose. Your caregiver   can make recommendations and may suggest nasal or lung inhalers for other symptoms.  HOME CARE INSTRUCTIONS   Only take over-the-counter or prescription medicines for pain, discomfort, or fever as directed by your  caregiver.  Use a warm mist humidifier or inhale steam from a shower to increase air moisture. This may keep secretions moist and make it easier to breathe.  Drink enough water and fluids to keep your urine clear or pale yellow.  Rest as needed.  Return to work when your temperature has returned to normal or as your caregiver advises. You may need to stay home longer to avoid infecting others. You can also use a face mask and careful hand washing to prevent spread of the virus. SEEK MEDICAL CARE IF:   After the first few days, you feel you are getting worse rather than better.  You need your caregiver's advice about medicines to control symptoms.  You develop chills, worsening shortness of breath, or brown or red sputum. These may be signs of pneumonia.  You develop yellow or brown nasal discharge or pain in the face, especially when you bend forward. These may be signs of sinusitis.  You develop a fever, swollen neck glands, pain with swallowing, or white areas in the back of your throat. These may be signs of strep throat. SEEK IMMEDIATE MEDICAL CARE IF:   You have a fever.  You develop severe or persistent headache, ear pain, sinus pain, or chest pain.  You develop wheezing, a prolonged cough, cough up blood, or have a change in your usual mucus (if you have chronic lung disease).  You develop sore muscles or a stiff neck. Document Released: 10/03/2000 Document Revised: 07/02/2011 Document Reviewed: 07/15/2013 [facility] Patient Information 2015 [place], [place]. This information is not intended to replace advice given to you by your health care provider. Make sure you discuss any questions you have with your health care provider.  Vasovagal Syncope, Adult Syncope, commonly known as fainting, is a temporary loss of consciousness. It occurs when the blood flow to the brain is reduced. Vasovagal syncope (also called neurocardiogenic syncope) is a fainting spell in which the blood flow  to the brain is reduced because of a sudden drop in heart rate and blood pressure. Vasovagal syncope occurs when the brain and the cardiovascular system (blood vessels) do not adequately communicate and respond to each other. This is the most common cause of fainting. It often occurs in response to fear or some other type of emotional or physical stress. The body has a reaction in which the heart starts beating too slowly or the blood vessels expand, reducing blood pressure. This type of fainting spell is generally considered harmless. However, injuries can occur if a person takes a sudden fall during a fainting spell.  CAUSES  Vasovagal syncope occurs when a person's blood pressure and heart rate decrease suddenly, usually in response to a trigger. Many things and situations can trigger an episode. Some of these include:   Pain.   Fear.   The sight of blood or medical procedures, such as blood being drawn from a vein.   Common activities, such as coughing, swallowing, stretching, or going to the bathroom.   Emotional stress.   Prolonged standing, especially in a warm environment.   Lack of sleep or rest.   Prolonged lack of food.   Prolonged lack of fluids.   Recent illness.  The use of certain drugs that affect blood pressure, such as cocaine, alcohol, marijuana, inhalants, and  opiates.  SYMPTOMS  Before the fainting episode, you may:   Feel dizzy or light headed.   Become pale.  Sense that you are going to faint.   Feel like the room is spinning.   Have tunnel vision, only seeing directly in front of you.   Feel sick to your stomach (nauseous).   See spots or slowly lose vision.   Hear ringing in your ears.   Have a headache.   Feel warm and sweaty.   Feel a sensation of pins and needles. During the fainting spell, you will generally be unconscious for no longer than a couple minutes before waking up and returning to normal. If you get up too  quickly before your body can recover, you may faint again. Some twitching or jerky movements may occur during the fainting spell.  DIAGNOSIS  Your caregiver will ask about your symptoms, take a medical history, and perform a physical exam. Various tests may be done to rule out other causes of fainting. These may include blood tests and tests to check the heart, such as electrocardiography, echocardiography, and possibly an electrophysiology study. When other causes have been ruled out, a test may be done to check the body's response to changes in position (tilt table test). TREATMENT  Most cases of vasovagal syncope do not require treatment. Your caregiver may recommend ways to avoid fainting triggers and may provide home strategies for preventing fainting. If you must be exposed to a possible trigger, you can drink additional fluids to help reduce your chances of having an episode of vasovagal syncope. If you have warning signs of an oncoming episode, you can respond by positioning yourself favorably (lying down). If your fainting spells continue, you may be given medicines to prevent fainting. Some medicines may help make you more resistant to repeated episodes of vasovagal syncope. Special exercises or compression stockings may be recommended. In rare cases, the surgical placement of a pacemaker is considered. HOME CARE INSTRUCTIONS   Learn to identify the warning signs of vasovagal syncope.   Sit or lie down at the first warning sign of a fainting spell. If sitting, put your head down between your legs. If you lie down, swing your legs up in the air to increase blood flow to the brain.   Avoid hot tubs and saunas.  Avoid prolonged standing.  Drink enough fluids to keep your urine clear or pale yellow. Avoid caffeine.  Increase salt in your diet as directed by your caregiver.   If you have to stand for a long time, perform movements such as:   Crossing your legs.   Flexing and  stretching your leg muscles.   Squatting.   Moving your legs.   Bending over.   Only take over-the-counter or prescription medicines as directed by your caregiver. Do not suddenly stop any medicines without asking your caregiver first. SEEK MEDICAL CARE IF:   Your fainting spells continue or happen more frequently in spite of treatment.   You lose consciousness for more than a couple minutes.  You have fainting spells during or after exercising or after being startled.   You have new symptoms that occur with the fainting spells, such as:   Shortness of breath.  Chest pain.   Irregular heartbeat.   You have episodes of twitching or jerky movements that last longer than a few seconds.  You have episodes of twitching or jerky movements without obvious fainting. SEEK IMMEDIATE MEDICAL CARE IF:   You have injuries or  bleeding after a fainting spell.   You have episodes of twitching or jerky movements that last longer than 5 minutes.   You have more than one spell of twitching or jerky movements before returning to consciousness after fainting. MAKE SURE YOU:   Understand these instructions.  Will watch your condition.  Will get help right away if you are not doing well or get worse. Document Released: 03/26/2012 Document Reviewed: 03/26/2012 [facility] Patient Information 2015 [place], [place]. This information is not intended to replace advice given to you by your health care provider. Make sure you discuss any questions you have with your health care provider.

## 2014-01-13 ENCOUNTER — Ambulatory Visit: Payer: Self-pay | Admitting: Obstetrics & Gynecology

## 2014-01-13 ENCOUNTER — Telehealth: Payer: Self-pay

## 2014-01-13 NOTE — Telephone Encounter (Signed)
Patient missed appointment today. Called patient who stated she knew she was going to have to pay almost $100 for the injection and she did not have it. Informed patient she can go to the health department and the injection will be cheaper. Patient stated she will call the health department and if she changes her mind will call and reschedule her appointment here. Informed patient to keep in mind she is over due for the injection and will be starting over. Patient verbalized understanding. No questions or concerns.

## 2014-02-22 ENCOUNTER — Encounter (HOSPITAL_COMMUNITY): Payer: Self-pay | Admitting: Emergency Medicine

## 2015-03-25 ENCOUNTER — Emergency Department (HOSPITAL_COMMUNITY): Payer: Self-pay

## 2015-03-25 ENCOUNTER — Emergency Department (HOSPITAL_COMMUNITY)
Admission: EM | Admit: 2015-03-25 | Discharge: 2015-03-25 | Disposition: A | Discharge status: Home / Self Care | Payer: Self-pay | Attending: Emergency Medicine | Admitting: Emergency Medicine

## 2015-03-25 ENCOUNTER — Encounter (HOSPITAL_COMMUNITY): Payer: Self-pay | Admitting: Family Medicine

## 2015-03-25 DIAGNOSIS — Y9289 Other specified places as the place of occurrence of the external cause: Secondary | ICD-10-CM | POA: Insufficient documentation

## 2015-03-25 DIAGNOSIS — S0012XA Contusion of left eyelid and periocular area, initial encounter: Secondary | ICD-10-CM | POA: Insufficient documentation

## 2015-03-25 DIAGNOSIS — Z87440 Personal history of urinary (tract) infections: Secondary | ICD-10-CM | POA: Insufficient documentation

## 2015-03-25 DIAGNOSIS — G8929 Other chronic pain: Secondary | ICD-10-CM | POA: Insufficient documentation

## 2015-03-25 DIAGNOSIS — F17210 Nicotine dependence, cigarettes, uncomplicated: Secondary | ICD-10-CM | POA: Insufficient documentation

## 2015-03-25 DIAGNOSIS — Z8619 Personal history of other infectious and parasitic diseases: Secondary | ICD-10-CM | POA: Insufficient documentation

## 2015-03-25 DIAGNOSIS — Y9389 Activity, other specified: Secondary | ICD-10-CM | POA: Insufficient documentation

## 2015-03-25 DIAGNOSIS — Z862 Personal history of diseases of the blood and blood-forming organs and certain disorders involving the immune mechanism: Secondary | ICD-10-CM | POA: Insufficient documentation

## 2015-03-25 DIAGNOSIS — Z79899 Other long term (current) drug therapy: Secondary | ICD-10-CM | POA: Insufficient documentation

## 2015-03-25 DIAGNOSIS — Y998 Other external cause status: Secondary | ICD-10-CM | POA: Insufficient documentation

## 2015-03-25 DIAGNOSIS — Z8719 Personal history of other diseases of the digestive system: Secondary | ICD-10-CM | POA: Insufficient documentation

## 2015-03-25 DIAGNOSIS — S0083XA Contusion of other part of head, initial encounter: Secondary | ICD-10-CM

## 2015-03-25 MED ORDER — HYDROCODONE-ACETAMINOPHEN 5-325 MG PO TABS: 1.0000 | ORAL_TABLET | Freq: Four times a day (QID) | ORAL | Status: DC | PRN

## 2015-03-25 MED ORDER — HYDROCODONE-ACETAMINOPHEN 5-325 MG PO TABS
2.0000 | ORAL_TABLET | Freq: Once | ORAL | Status: AC
  Administered 2015-03-25: 2 via ORAL
  Filled 2015-03-25: qty 2
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
## 2015-03-25 NOTE — ED Notes (Signed)
Dr. Delo at bedside. 

## 2015-03-25 NOTE — Discharge Instructions (Signed)
Hydrocodone as prescribed as needed for pain.   Contusion A contusion is a deep bruise. Contusions are the result of a blunt injury to tissues and muscle fibers under the skin. The injury causes bleeding under the skin. The skin overlying the contusion may turn blue, purple, or yellow. Minor injuries will give you a painless contusion, but more severe contusions may stay painful and swollen for a few weeks.  CAUSES  This condition is usually caused by a blow, trauma, or direct force to an area of the body. SYMPTOMS  Symptoms of this condition include:  Swelling of the injured area.  Pain and tenderness in the injured area.  Discoloration. The area may have redness and then turn blue, purple, or yellow. DIAGNOSIS  This condition is diagnosed based on a physical exam and medical history. An X-ray, CT scan, or MRI may be needed to determine if there are any associated injuries, such as broken bones (fractures). TREATMENT  Specific treatment for this condition depends on what area of the body was injured. In general, the best treatment for a contusion is resting, icing, applying pressure to (compression), and elevating the injured area. This is often called the RICE strategy. Over-the-counter anti-inflammatory medicines may also be recommended for pain control.  HOME CARE INSTRUCTIONS   Rest the injured area.  If directed, apply ice to the injured area:  Put ice in a plastic bag.  Place a towel between your skin and the bag.  Leave the ice on for 20 minutes, 2-3 times per day.  If directed, apply light compression to the injured area using an elastic bandage. Make sure the bandage is not wrapped too tightly. Remove and reapply the bandage as directed by your health care provider.  If possible, raise (elevate) the injured area above the level of your heart while you are sitting or lying down.  Take over-the-counter and prescription medicines only as told by your health care  provider. SEEK MEDICAL CARE IF:  Your symptoms do not improve after several days of treatment.  Your symptoms get worse.  You have difficulty moving the injured area. SEEK IMMEDIATE MEDICAL CARE IF:   You have severe pain.  You have numbness in a hand or foot.  Your hand or foot turns pale or cold.   This information is not intended to replace advice given to you by your health care provider. Make sure you discuss any questions you have with your health care provider.   Document Released: 01/17/2005 Document Revised: 12/29/2014 Document Reviewed: 08/25/2014 [employer] Interactive Patient Education [employer]

## 2015-03-25 NOTE — ED Notes (Signed)
Patient transported to CT 
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
## 2015-05-13 ENCOUNTER — Encounter (HOSPITAL_COMMUNITY): Payer: Self-pay | Admitting: *Deleted

## 2015-05-13 ENCOUNTER — Emergency Department (HOSPITAL_COMMUNITY)
Admission: EM | Admit: 2015-05-13 | Discharge: 2015-05-13 | Disposition: A | Discharge status: Home / Self Care | Payer: Self-pay | Attending: Emergency Medicine | Admitting: Emergency Medicine

## 2015-05-13 ENCOUNTER — Emergency Department (HOSPITAL_COMMUNITY): Payer: Self-pay

## 2015-05-13 DIAGNOSIS — F10129 Alcohol abuse with intoxication, unspecified: Secondary | ICD-10-CM | POA: Insufficient documentation

## 2015-05-13 DIAGNOSIS — M25561 Pain in right knee: Secondary | ICD-10-CM

## 2015-05-13 DIAGNOSIS — Y998 Other external cause status: Secondary | ICD-10-CM | POA: Insufficient documentation

## 2015-05-13 DIAGNOSIS — S81011A Laceration without foreign body, right knee, initial encounter: Secondary | ICD-10-CM | POA: Insufficient documentation

## 2015-05-13 DIAGNOSIS — W1839XA Other fall on same level, initial encounter: Secondary | ICD-10-CM | POA: Insufficient documentation

## 2015-05-13 DIAGNOSIS — Z8739 Personal history of other diseases of the musculoskeletal system and connective tissue: Secondary | ICD-10-CM | POA: Insufficient documentation

## 2015-05-13 DIAGNOSIS — Z8619 Personal history of other infectious and parasitic diseases: Secondary | ICD-10-CM | POA: Insufficient documentation

## 2015-05-13 DIAGNOSIS — Z8719 Personal history of other diseases of the digestive system: Secondary | ICD-10-CM | POA: Insufficient documentation

## 2015-05-13 DIAGNOSIS — Z87440 Personal history of urinary (tract) infections: Secondary | ICD-10-CM | POA: Insufficient documentation

## 2015-05-13 DIAGNOSIS — Y9389 Activity, other specified: Secondary | ICD-10-CM | POA: Insufficient documentation

## 2015-05-13 DIAGNOSIS — Y9289 Other specified places as the place of occurrence of the external cause: Secondary | ICD-10-CM | POA: Insufficient documentation

## 2015-05-13 DIAGNOSIS — G8929 Other chronic pain: Secondary | ICD-10-CM | POA: Insufficient documentation

## 2015-05-13 DIAGNOSIS — F17210 Nicotine dependence, cigarettes, uncomplicated: Secondary | ICD-10-CM | POA: Insufficient documentation

## 2015-05-13 MED ORDER — HYDROCODONE-ACETAMINOPHEN 5-325 MG PO TABS: 1.0000 | ORAL_TABLET | ORAL | Status: AC | PRN

## 2015-05-13 MED ORDER — TRAMADOL HCL 50 MG PO TABS: 50.0000 mg | ORAL_TABLET | Freq: Four times a day (QID) | ORAL | Status: AC | PRN

## 2015-05-13 MED ORDER — OXYCODONE-ACETAMINOPHEN 5-325 MG PO TABS
2.0000 | ORAL_TABLET | Freq: Once | ORAL | Status: AC
  Administered 2015-05-13: 2 via ORAL
  Filled 2015-05-13: qty 2

## 2015-05-13 NOTE — Discharge Instructions (Signed)
Use crutches until you are able to ambulate without pain.
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
## 2015-07-09 ENCOUNTER — Emergency Department (HOSPITAL_COMMUNITY)
Admission: EM | Admit: 2015-07-09 | Discharge: 2015-07-09 | Disposition: A | Discharge status: Home / Self Care | Payer: Self-pay | Attending: Emergency Medicine | Admitting: Emergency Medicine

## 2015-07-09 ENCOUNTER — Encounter (HOSPITAL_COMMUNITY): Payer: Self-pay | Admitting: *Deleted

## 2015-07-09 DIAGNOSIS — Y9289 Other specified places as the place of occurrence of the external cause: Secondary | ICD-10-CM | POA: Insufficient documentation

## 2015-07-09 DIAGNOSIS — Z862 Personal history of diseases of the blood and blood-forming organs and certain disorders involving the immune mechanism: Secondary | ICD-10-CM | POA: Insufficient documentation

## 2015-07-09 DIAGNOSIS — Z87440 Personal history of urinary (tract) infections: Secondary | ICD-10-CM | POA: Insufficient documentation

## 2015-07-09 DIAGNOSIS — F17210 Nicotine dependence, cigarettes, uncomplicated: Secondary | ICD-10-CM | POA: Insufficient documentation

## 2015-07-09 DIAGNOSIS — Y288XXA Contact with other sharp object, undetermined intent, initial encounter: Secondary | ICD-10-CM | POA: Insufficient documentation

## 2015-07-09 DIAGNOSIS — G8929 Other chronic pain: Secondary | ICD-10-CM | POA: Insufficient documentation

## 2015-07-09 DIAGNOSIS — Y9389 Activity, other specified: Secondary | ICD-10-CM | POA: Insufficient documentation

## 2015-07-09 DIAGNOSIS — Z8719 Personal history of other diseases of the digestive system: Secondary | ICD-10-CM | POA: Insufficient documentation

## 2015-07-09 DIAGNOSIS — Y998 Other external cause status: Secondary | ICD-10-CM | POA: Insufficient documentation

## 2015-07-09 DIAGNOSIS — Z8619 Personal history of other infectious and parasitic diseases: Secondary | ICD-10-CM | POA: Insufficient documentation

## 2015-07-09 DIAGNOSIS — S61511A Laceration without foreign body of right wrist, initial encounter: Secondary | ICD-10-CM | POA: Insufficient documentation

## 2015-07-09 MED ORDER — HYDROCODONE-ACETAMINOPHEN 5-325 MG PO TABS: 1.0000 | ORAL_TABLET | ORAL | Status: AC | PRN

## 2015-07-09 MED ORDER — DIAZEPAM 5 MG/ML IJ SOLN
5.0000 mg | Freq: Once | INTRAMUSCULAR | Status: AC
  Administered 2015-07-09: 5 mg via INTRAMUSCULAR
  Filled 2015-07-09: qty 2

## 2015-07-09 MED ORDER — LIDOCAINE HCL 2 % IJ SOLN
5.0000 mL | Freq: Once | INTRAMUSCULAR | Status: DC
Start: 2015-07-09 — End: 2015-07-09
  Filled 2015-07-09: qty 20

## 2015-07-09 MED ORDER — HYDROCODONE-ACETAMINOPHEN 5-325 MG PO TABS
1.0000 | ORAL_TABLET | Freq: Once | ORAL | Status: AC
  Administered 2015-07-09: 1 via ORAL
  Filled 2015-07-09: qty 1
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
## 2015-07-09 NOTE — Discharge Instructions (Signed)
Please return to the ER in 10-14 days or go to any urgent care for suture removal. In the meantime I will give you a small prescription for Vicodin (strong painkiller). You may take regular tylenol instead if your pain is not that severe.  Take medications as prescribed. Return to the emergency room for worsening condition or new concerning symptoms. Follow up with your regular doctor. If you don't have a regular doctor use one of the numbers below to establish a primary care doctor.   Emergency Department Resource Guide 1) Find a Doctor and Pay Out of Pocket Although you won't have to find out who is covered by your insurance plan, it is a good idea to ask around and get recommendations. You will then need to call the office and see if the doctor you have chosen will accept you as a new patient and what types of options they offer for patients who are self-pay. Some doctors offer discounts or will set up payment plans for their patients who do not have insurance, but you will need to ask so you aren't surprised when you get to your appointment.  2) Contact Your Local Health Department Not all health departments have doctors that can see patients for sick visits, but many do, so it is worth a call to see if yours does. If you don't know where your local health department is, you can check in your phone book. The CDC also has a tool to help you locate your state's health department, and many state websites also have listings of all of their local health departments.  3) Find a Walk-in Clinic If your illness is not likely to be very severe or complicated, you may want to try a walk in clinic. These are popping up all over the country in pharmacies, drugstores, and shopping centers. They're usually staffed by nurse practitioners or physician assistants that have been trained to treat common illnesses and complaints. They're usually fairly quick and inexpensive. However, if you have serious medical issues  or chronic medical problems, these are probably not your best option.  No Primary Care Doctor: - Call Health Connect at  [phone] - they can help you locate a primary care doctor that  accepts your insurance, provides certain services, etc. - Physician Referral [phone]- [phone]  Emergency Department Resource Guide 1) Find a Doctor and Pay Out of Pocket Although you won't have to find out who is covered by your insurance plan, it is a good idea to ask around and get recommendations. You will then need to call the office and see if the doctor you have chosen will accept you as a new patient and what types of options they offer for patients who are self-pay. Some doctors offer discounts or will set up payment plans for their patients who do not have insurance, but you will need to ask so you aren't surprised when you get to your appointment.  2) Contact Your Local Health Department Not all health departments have doctors that can see patients for sick visits, but many do, so it is worth a call to see if yours does. If you don't know where your local health department is, you can check in your phone book. The CDC also has a tool to help you locate your state's health department, and many state websites also have listings of all of their local health departments.  3) Find a Walk-in Clinic If your illness is not likely to be very severe or complicated,  you may want to try a walk in clinic. These are popping up all over the country in pharmacies, drugstores, and shopping centers. They're usually staffed by nurse practitioners or physician assistants that have been trained to treat common illnesses and complaints. They're usually fairly quick and inexpensive. However, if you have serious medical issues or chronic medical problems, these are probably not your best option.  No Primary Care Doctor: - Call Health Connect at  [phone] - they can help you locate a primary care doctor that  accepts your  insurance, provides certain services, etc. - Physician Referral Service- [phone]  Chronic Pain Problems: Organization         Address  Phone   Notes  [facility]  [phone] Patients need to be referred by their primary care doctor.   Medication Assistance: Organization         Address  Phone   Notes  [facility] [address] [place] [place], [place] [address] [phone] --Must be a resident of [facility] -- Must have NO insurance coverage whatsoever (no Medicaid/ Medicare, etc.) -- The pt. MUST have a primary care doctor that directs their care regularly and follows them in the community   [facility]  [phone]   [employer]  [phone]    Agencies that provide inexpensive medical care: Organization         Address  Phone   Notes  [facility]  [phone]   [facility]    [phone]   [facility] [address] [place], [place] [address] [phone]   [facility] [address] [place], [place] [phone]   [facility]    [phone]   [facility]    [phone]   [facility]  [address], [place] Phone:  [phone], Fax:  [phone] Hours of Operation:  9 am - 6 pm, M-F.  Also accepts Medicaid/Medicare and self-pay.  [facility]  [address], [place] Phone: [phone], Fax: [phone]. Hours of Operation:  8:30 am - 5:30 pm, M-F.  Also accepts Medicaid and self-pay.  [facility] [place] [address] [place] Phone: [phone]   [facility] [address] Natasha Bence [place], [place] [phone], Ext. [phone] Mondays & Thursdays: 7-9 AM.  First 15 patients are seen on a first come, first serve basis.    Medicaid-accepting [facility] Providers:  Organization          Address  Phone   Notes  [facility] [address], [place] [phone] Also accepts self-pay patients.  [facility] [address] Laurell Josephs [place], [place]  [phone]   [facility] [address], [place] [phone]   [facility] [address] [place] [phone]   Renaye Rakers [address], [place]   [phone] Only accepts [place] patients after they have their name applied to their card.   Self-Pay (no insurance) in [facility]:  Organization         Address  Phone   Notes  Sickle Cell Patients, [facility] [address] [place], [place] [phone]   [facility] [address] [place],  [place] [phone]   [facility] [place]  [address],  [phone]   [facility]/Dr. Osei-Bonsu  [address], [place] or [address], [place] [phone] Phone number for both [place] and [place] locations is the same.  [facility] and [facility] [address], [place] [phone]   [facility] [address], [place] or [address] [phone]   [facility] [address] [place] [phone], phone; [phone], fax Sees patients 1st and 3rd Saturday of every month.  Must not qualify for public or private insurance (i.e. Medicaid, Medicare, [place] Health Choice, Veterans' Benefits)  Household income should be no more than 200% of the poverty level The clinic cannot treat you if you are pregnant or think you are pregnant  Sexually transmitted diseases are not treated at the clinic.      Laceration Care, Adult A laceration is a cut that goes through all of the layers of the skin and into the tissue that is right under the skin.  Some lacerations heal on their own. Others need to be closed with stitches (sutures), staples, skin adhesive strips, or skin glue. Proper laceration care minimizes the risk of infection and helps the laceration to heal better. HOW TO CARE FOR YOUR LACERATION If sutures or staples were used:  Keep the wound clean and dry.  If you were given a bandage (dressing), you should change it at least one time per day or as told by your health care provider. You should also change it if it becomes wet or dirty.  Keep the wound completely dry for the first 24 hours or as told by your health care provider. After that time, you may shower or bathe. However, make sure that the wound is not soaked in water until after the sutures or staples have been removed.  Clean the wound one time each day or as told by your health care provider:  Wash the wound with soap and water.  Rinse the wound with water to remove all soap.  Pat the wound dry with a clean towel. Do not rub the wound.  After cleaning the wound, apply a thin layer of antibiotic ointmentas told by your health care provider. This will help to prevent infection and keep the dressing from sticking to the wound.  Have the sutures or staples removed as told by your health care provider. If skin adhesive strips were used:  Keep the wound clean and dry.  If you were given a bandage (dressing), you should change it at least one time per day or as told by your health care provider. You should also change it if it becomes dirty or wet.  Do not get the skin adhesive strips wet. You may shower or bathe, but be careful to keep the wound dry.  If the wound gets wet, pat it dry with a clean towel. Do not rub the wound.  Skin adhesive strips fall off on their own. You may trim the strips as the wound heals. Do not remove skin adhesive strips that are still stuck to the wound. They will fall off in time. If skin glue was used:  Try to keep the wound dry,  but you may briefly wet it in the shower or bath. Do not soak the wound in water, such as by swimming.  After you have showered  or bathed, gently pat the wound dry with a clean towel. Do not rub the wound.  Do not do any activities that will make you sweat heavily until the skin glue has fallen off on its own.  Do not apply liquid, cream, or ointment medicine to the wound while the skin glue is in place. Using those may loosen the film before the wound has healed.  If you were given a bandage (dressing), you should change it at least one time per day or as told by your health care provider. You should also change it if it becomes dirty or wet.  If a dressing is placed over the wound, be careful not to apply tape directly over the skin glue. Doing that may cause the glue to be pulled off before the wound has healed.  Do not pick at the glue. The skin glue usually remains in place for 5-10 days, then it falls off of the skin. General Instructions  Take over-the-counter and prescription medicines only as told by your health care provider.  If you were prescribed an antibiotic medicine or ointment, take or apply it as told by your doctor. Do not stop using it even if your condition improves.  To help prevent scarring, make sure to cover your wound with sunscreen whenever you are outside after stitches are removed, after adhesive strips are removed, or when glue remains in place and the wound is healed. Make sure to wear a sunscreen of at least 30 SPF.  Do not scratch or pick at the wound.  Keep all follow-up visits as told by your health care provider. This is important.  Check your wound every day for signs of infection. Watch for:  Redness, swelling, or pain.  Fluid, blood, or pus.  Raise (elevate) the injured area above the level of your heart while you are sitting or lying down, if possible. SEEK MEDICAL CARE IF:  You received a tetanus shot and you have swelling, severe pain,  redness, or bleeding at the injection site.  You have a fever.  A wound that was closed breaks open.  You notice a bad smell coming from your wound or your dressing.  You notice something coming out of the wound, such as wood or glass.  Your pain is not controlled with medicine.  You have increased redness, swelling, or pain at the site of your wound.  You have fluid, blood, or pus coming from your wound.  You notice a change in the color of your skin near your wound.  You need to change the dressing frequently due to fluid, blood, or pus draining from the wound.  You develop a new rash.  You develop numbness around the wound. SEEK IMMEDIATE MEDICAL CARE IF:  You develop severe swelling around the wound.  Your pain suddenly increases and is severe.  You develop painful lumps near the wound or on skin that is anywhere on your body.  You have a red streak going away from your wound.  The wound is on your hand or foot and you cannot properly move a finger or toe.  The wound is on your hand or foot and you notice that your fingers or toes look pale or bluish.   This information is not intended to replace advice given to you by your health care provider. Make sure you discuss any questions you have with your health care provider.   Document Released: 04/09/2005 Document Revised: 08/24/2014 Document Reviewed: 04/05/2014 Elsevier Interactive Patient Education  2016 Elsevier Inc. ° °
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
## 2015-07-09 NOTE — ED Notes (Signed)
Declined W/C at D/C and was escorted to lobby by RN. 

## 2015-12-28 ENCOUNTER — Emergency Department (HOSPITAL_COMMUNITY)
Admission: EM | Admit: 2015-12-28 | Discharge: 2015-12-28 | Disposition: A | Discharge status: Home / Self Care | Payer: No Typology Code available for payment source | Attending: Emergency Medicine | Admitting: Emergency Medicine

## 2015-12-28 ENCOUNTER — Encounter (HOSPITAL_COMMUNITY): Payer: Self-pay | Admitting: Emergency Medicine

## 2015-12-28 DIAGNOSIS — Y939 Activity, unspecified: Secondary | ICD-10-CM | POA: Insufficient documentation

## 2015-12-28 DIAGNOSIS — F17210 Nicotine dependence, cigarettes, uncomplicated: Secondary | ICD-10-CM | POA: Insufficient documentation

## 2015-12-28 DIAGNOSIS — M545 Low back pain, unspecified: Secondary | ICD-10-CM

## 2015-12-28 DIAGNOSIS — Y92410 Unspecified street and highway as the place of occurrence of the external cause: Secondary | ICD-10-CM | POA: Insufficient documentation

## 2015-12-28 DIAGNOSIS — Z7982 Long term (current) use of aspirin: Secondary | ICD-10-CM | POA: Insufficient documentation

## 2015-12-28 DIAGNOSIS — Y999 Unspecified external cause status: Secondary | ICD-10-CM | POA: Diagnosis not present

## 2015-12-28 MED ORDER — CYCLOBENZAPRINE HCL 10 MG PO TABS: 10.0000 mg | ORAL_TABLET | Freq: Two times a day (BID) | ORAL | 0 refills | Status: AC | PRN

## 2015-12-28 MED ORDER — ACETAMINOPHEN-CODEINE #3 300-30 MG PO TABS: 1.0000 | ORAL_TABLET | Freq: Four times a day (QID) | ORAL | 0 refills | Status: AC | PRN

## 2015-12-28 MED ORDER — HYDROCODONE-ACETAMINOPHEN 5-325 MG PO TABS
1.0000 | ORAL_TABLET | Freq: Once | ORAL | Status: AC
  Administered 2015-12-28: 1 via ORAL
  Filled 2015-12-28: qty 1

## 2015-12-28 NOTE — Discharge Instructions (Signed)
Follow up with your primary care provider/you neurosurgeon for re-evaluation. Apply ice to affected area. Take pain medications and muscle relaxers as needed. Increase mobility, try to massage and stretch back muscles. Return to the ED if you experience severe worsening of your symptoms, weakness/tingling in your extremities, loss of control of your bowel or bladder, fever, chills.

## 2015-12-28 NOTE — ED Notes (Signed)
Informed by secretary that patient stopped her in hall to ask if she had been forgotten about in subwaiting.  This RN went in to reassure her and inform her that we would get her in to a room as soon as one became available.  Comfort measures provided, pt verbalized understanding.

## 2015-12-28 NOTE — ED Notes (Signed)
Patient able to ambulate independently  
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
## 2017-03-20 ENCOUNTER — Emergency Department (HOSPITAL_COMMUNITY): Payer: Self-pay

## 2017-03-20 ENCOUNTER — Emergency Department (HOSPITAL_COMMUNITY)
Admission: EM | Admit: 2017-03-20 | Discharge: 2017-03-20 | Disposition: A | Discharge status: Home / Self Care | Payer: Self-pay | Attending: Emergency Medicine | Admitting: Emergency Medicine

## 2017-03-20 ENCOUNTER — Encounter (HOSPITAL_COMMUNITY): Payer: Self-pay

## 2017-03-20 ENCOUNTER — Other Ambulatory Visit: Payer: Self-pay

## 2017-03-20 DIAGNOSIS — K8020 Calculus of gallbladder without cholecystitis without obstruction: Secondary | ICD-10-CM | POA: Insufficient documentation

## 2017-03-20 DIAGNOSIS — Z79899 Other long term (current) drug therapy: Secondary | ICD-10-CM | POA: Insufficient documentation

## 2017-03-20 DIAGNOSIS — K8050 Calculus of bile duct without cholangitis or cholecystitis without obstruction: Secondary | ICD-10-CM

## 2017-03-20 DIAGNOSIS — Z7982 Long term (current) use of aspirin: Secondary | ICD-10-CM | POA: Insufficient documentation

## 2017-03-20 DIAGNOSIS — F17210 Nicotine dependence, cigarettes, uncomplicated: Secondary | ICD-10-CM | POA: Insufficient documentation

## 2017-03-20 LAB — BASIC METABOLIC PANEL
Anion gap: 12 (ref 5–15)
BUN: 15 mg/dL (ref 6–20)
CALCIUM: 9.6 mg/dL (ref 8.9–10.3)
CO2: 21 mmol/L — ABNORMAL LOW (ref 22–32)
CREATININE: 0.66 mg/dL (ref 0.44–1.00)
Chloride: 106 mmol/L (ref 101–111)
GFR calc Af Amer: >60 mL/min (ref >60)
GLUCOSE: 90 mg/dL (ref 65–99)
Potassium: 3.5 mmol/L (ref 3.5–5.1)
SODIUM: 139 mmol/L (ref 135–145)

## 2017-03-20 LAB — URINALYSIS, ROUTINE W REFLEX MICROSCOPIC
Bilirubin Urine: NEGATIVE
GLUCOSE, UA: NEGATIVE mg/dL
Ketones, ur: NEGATIVE mg/dL
Leukocytes, UA: NEGATIVE
Nitrite: NEGATIVE
PH: 8 (ref 5.0–8.0)
Protein, ur: 100 mg/dL — AB
Specific Gravity, Urine: 1.021 (ref 1.005–1.030)

## 2017-03-20 LAB — WET PREP, GENITAL
CLUE CELLS WET PREP: NONE SEEN
Sperm: NONE SEEN
TRICH WET PREP: NONE SEEN
Yeast Wet Prep HPF POC: NONE SEEN

## 2017-03-20 LAB — CBC
HCT: 40 % (ref 36.0–46.0)
Hemoglobin: 13.2 g/dL (ref 12.0–15.0)
MCH: 29.7 pg (ref 26.0–34.0)
MCHC: 33 g/dL (ref 30.0–36.0)
MCV: 89.9 fL (ref 78.0–100.0)
PLATELETS: 248 10*3/uL (ref 150–400)
RBC: 4.45 MIL/uL (ref 3.87–5.11)
RDW: 13.5 % (ref 11.5–15.5)
WBC: 7.8 10*3/uL (ref 4.0–10.5)

## 2017-03-20 LAB — HEPATIC FUNCTION PANEL
ALBUMIN: 4.7 g/dL (ref 3.5–5.0)
ALT: 20 U/L (ref 14–54)
AST: 37 U/L (ref 15–41)
Alkaline Phosphatase: 57 U/L (ref 38–126)
BILIRUBIN TOTAL: 0.4 mg/dL (ref 0.3–1.2)
Bilirubin, Direct: <0.1 mg/dL — ABNORMAL LOW (ref 0.1–0.5)
TOTAL PROTEIN: 8.1 g/dL (ref 6.5–8.1)

## 2017-03-20 LAB — LIPASE, BLOOD: LIPASE: 27 U/L (ref 11–51)

## 2017-03-20 LAB — I-STAT BETA HCG BLOOD, ED (MC, WL, AP ONLY)

## 2017-03-20 MED ORDER — ACETAMINOPHEN 325 MG PO TABS: 650.0000 mg | ORAL_TABLET | Freq: Three times a day (TID) | ORAL | 0 refills | Status: AC

## 2017-03-20 MED ORDER — OXYCODONE-ACETAMINOPHEN 5-325 MG PO TABS
1.0000 | ORAL_TABLET | Freq: Once | ORAL | Status: AC
  Administered 2017-03-20: 1 via ORAL
  Filled 2017-03-20: qty 1

## 2017-03-20 MED ORDER — ONDANSETRON 4 MG PO TBDP
ORAL_TABLET | ORAL | Status: AC
  Administered 2017-03-20: 4 mg
  Filled 2017-03-20: qty 1

## 2017-03-20 MED ORDER — MORPHINE SULFATE 30 MG PO TABS: 30.0000 mg | ORAL_TABLET | Freq: Four times a day (QID) | ORAL | 0 refills | Status: AC | PRN

## 2017-03-20 MED ORDER — ONDANSETRON 4 MG PO TBDP: 4.0000 mg | ORAL_TABLET | Freq: Three times a day (TID) | ORAL | 0 refills | Status: DC | PRN

## 2017-03-20 MED ORDER — ACETAMINOPHEN 325 MG PO TABS
650.0000 mg | ORAL_TABLET | Freq: Three times a day (TID) | ORAL | 0 refills | Status: DC
Start: 2017-03-20 — End: 2017-03-20

## 2017-03-20 MED ORDER — ONDANSETRON 4 MG PO TBDP
4.0000 mg | ORAL_TABLET | Freq: Once | ORAL | Status: AC
  Administered 2017-03-20: 4 mg via ORAL
  Filled 2017-03-20: qty 1

## 2017-03-20 MED ORDER — ACETAMINOPHEN 500 MG PO TABS
500.0000 mg | ORAL_TABLET | Freq: Once | ORAL | Status: AC
  Administered 2017-03-20: 500 mg via ORAL
  Filled 2017-03-20: qty 1

## 2017-03-20 MED ORDER — MORPHINE SULFATE 30 MG PO TABS: 30.0000 mg | ORAL_TABLET | Freq: Four times a day (QID) | ORAL | 0 refills | Status: DC | PRN

## 2017-03-20 MED ORDER — ONDANSETRON HCL 4 MG PO TABS: 4.0000 mg | ORAL_TABLET | Freq: Once | ORAL | Status: DC

## 2017-03-20 MED ORDER — ONDANSETRON 4 MG PO TBDP: 4.0000 mg | ORAL_TABLET | Freq: Three times a day (TID) | ORAL | 0 refills | Status: AC | PRN

## 2017-03-20 NOTE — ED Notes (Signed)
Patient transported to CT 

## 2017-03-20 NOTE — Discharge Instructions (Signed)
Take take morphine as needed for pain. Use Zofran as needed for nausea. It is important that you contact the surgeon tomorrow to set up an appointment for further management of your symptoms. Return to the emergency room if you develop high fevers, persistent vomiting despite medication, or any new or worsening symptoms.
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
## 2017-03-20 NOTE — ED Provider Notes (Signed)
[facility] Provider Note   CSN: [id number] Arrival date & time: 03/20/17  1555     History   Chief Complaint Chief Complaint  Patient presents with  . Vaginal Bleeding    HPI Debra Jensen is a 26 y.o. female presenting with right sided pelvic pain and vaginal bleeding.  Pt states the pain began around 630 this morning.  It started as a cramping feeling like a period cramp.  Since then it has grown.  She states it feels like contractions.  They are intermittent and severe.  It is only on the right side.  She has never had pain like this before.  She reports associated nausea and vomiting with the pain.  She states she has thrown up 5 times.  Has a history of tubal ligation in 2014.  She is sexually active and does not use any form of birth control.  She denies fall, trauma, or injury.  She denies fevers, chills, cough, chest pain, shortness of breath, upper abdominal pain, urinary symptoms, vaginal discharge, or abnormal bowel movements. Pt supposed to start her period in the next few days.   HPI  Past Medical History:  Diagnosis Date  . Anemia    with preg  . Back pain, chronic   . Chlamydia   . GERD (gastroesophageal reflux disease)   . Headache(784.0)   . Reflux   . Scoliosis   . Urinary tract infection     Patient Active Problem List   Diagnosis Date Noted  . S/P Repeat Cesarean Section and BTS 08/12/2012  . Low back pain 07/09/2012  . Desires Sterilization 07/09/2012  . Eczema 07/09/2012  . Drug use complicating pregnancy 06/17/2012  . Previous cesarean delivery, antepartum condition or complication 04/10/2012  . Supervision of high risk pregnancy in second trimester 04/07/2012  . Scoliosis 04/07/2012    Past Surgical History:  Procedure Laterality Date  . BACK SURGERY     harrington rods  . CESAREAN SECTION    . CESAREAN SECTION N/A 08/12/2012   Procedure: CESAREAN SECTION;  Surgeon: Tereso Newcomer, MD;  Location: [facility];  Service: Obstetrics;  Laterality: N/A;  . TUBAL LIGATION  08/12/2012   Procedure: BILATERAL TUBAL LIGATION;  Surgeon: Tereso Newcomer, MD;  Location: [facility] ORS;  Service: Obstetrics;;    OB History    Gravida Para Term Preterm AB Living   3 3 3  0 0 3   SAB TAB Ectopic Multiple Live Births   0 0 0 0 3       Home Medications    Prior to Admission medications   Medication Sig Start Date End Date Taking? Authorizing Provider  acetaminophen (TYLENOL) 325 MG tablet Take 2 tablets (650 mg total) by mouth 3 (three) times daily with meals. 03/20/17   Kanton Kamel, PA-C  acetaminophen-codeine (TYLENOL #3) 300-30 MG tablet Take 1-2 tablets by mouth every 6 (six) hours as needed for moderate pain. Patient not taking: Reported on 03/20/2017 12/28/15   Dowless, Lelon Mast Tripp, PA-C  albuterol (PROVENTIL HFA;VENTOLIN HFA) 108 (90 BASE) MCG/ACT inhaler Inhale 2 puffs into the lungs every 4 (four) hours as needed for wheezing or shortness of breath. 01/10/14   Ward, Layla Maw, DO  aspirin-acetaminophen-caffeine (EXCEDRIN MIGRAINE) [phone] MG tablet Take 2 tablets by mouth every 6 (six) hours as needed for headache.    [provider]  cyclobenzaprine (FLEXERIL) 10 MG tablet Take 1 tablet (10 mg total) by mouth 2 (two) times daily as  needed for muscle spasms. Patient not taking: Reported on 03/20/2017 12/28/15   Dowless, Lelon MastSamantha Tripp, PA-C  dextromethorphan-guaiFENesin (MUCINEX DM) 30-600 MG per 12 hr tablet Take 1 tablet by mouth 2 (two) times daily as needed for cough (for mucus).    [provider]  HYDROcodone-acetaminophen (NORCO/VICODIN) 5-325 MG tablet Take 1-2 tablets by mouth every 4 (four) hours as needed. Patient not taking: Reported on 03/20/2017 05/13/15   Santiago GladLaisure, Heather, PA-C  HYDROcodone-acetaminophen (NORCO/VICODIN) 5-325 MG tablet Take 1 tablet by mouth every 4 (four) hours as needed. Patient not taking: Reported on 03/20/2017 07/09/15   Sam, Ace GinsSerena Y, PA-C    ibuprofen (ADVIL,MOTRIN) 200 MG tablet Take 600 mg by mouth every 6 (six) hours as needed for cramping.    [provider]  morphine (MSIR) 30 MG tablet Take 1 tablet (30 mg total) by mouth every 6 (six) hours as needed for severe pain. 03/20/17   Daronte Shostak, PA-C  ondansetron (ZOFRAN ODT) 4 MG disintegrating tablet Take 1 tablet (4 mg total) by mouth every 8 (eight) hours as needed for nausea or vomiting. 03/20/17   Azhane Eckart, PA-C  Pseudoephedrine HCl (NASAL DECONGESTANT ADULT PO) Take 1 tablet by mouth daily as needed (for congestion).    [provider]  traMADol (ULTRAM) 50 MG tablet Take 1 tablet (50 mg total) by mouth every 6 (six) hours as needed. Patient not taking: Reported on 03/20/2017 05/13/15   Santiago GladLaisure, Heather, PA-C    Family History Family History  Problem Relation Age of Onset  . Hypertension Other   . Hypertension Mother   . Cancer Mother   . Cancer Paternal Uncle   . Other Neg Hx     Social History Social History   Tobacco Use  . Smoking status: Current Every Day Smoker    Packs/day: 0.25    Years: 4.00    Pack years: 1.00    Types: Cigarettes  . Smokeless tobacco: Never Used  Substance Use Topics  . Alcohol use: Yes    Comment: occasional/social  . Drug use: No     Allergies   Motrin [ibuprofen] and Naproxen   Review of Systems Review of Systems  Gastrointestinal: Positive for abdominal pain, nausea and vomiting.  Genitourinary: Positive for vaginal bleeding.  All other systems reviewed and are negative.    Physical Exam Updated Vital Signs BP 116/82 (BP Location: Left Arm)   Pulse 95   Resp 18   Ht 5\' 2"  (1.575 m)   Wt 54.9 kg (121 lb)   SpO2 100%   BMI 22.13 kg/m   Physical Exam  Constitutional: She is oriented to person, place, and time. She appears well-developed and well-nourished.  HENT:  Head: Normocephalic and atraumatic.  Eyes: EOM are normal.  Neck: Normal range of motion.  Cardiovascular:  Normal rate, regular rhythm and intact distal pulses.  Pulmonary/Chest: Effort normal and breath sounds normal. No respiratory distress. She has no wheezes.  Abdominal: Soft. Bowel sounds are normal. She exhibits no distension. There is tenderness.  TTP of RLQ. No rebound, distention or rigidity. No TTP elsewhere in the abd  Genitourinary: Rectum normal, vagina normal and uterus normal. Pelvic exam was performed with patient supine. There is no rash, tenderness or lesion on the right labia. There is no rash, tenderness or lesion on the left labia. Cervix exhibits no motion tenderness, no discharge and no friability.  Genitourinary Comments: Chaperone present. Bleeding noted on exam. No discharge noted. No masses noted.   Musculoskeletal:  Normal range of motion.  Neurological: She is alert and oriented to person, place, and time.  Skin: Skin is warm and dry.  Psychiatric: She has a normal mood and affect.  Nursing note and vitals reviewed.    ED Treatments / Results  Labs (all labs ordered are listed, but only abnormal results are displayed) Labs Reviewed  WET PREP, GENITAL - Abnormal; Notable for the following components:      Result Value   WBC, Wet Prep HPF POC FEW (*)    All other components within normal limits  BASIC METABOLIC PANEL - Abnormal; Notable for the following components:   CO2 21 (*)    All other components within normal limits  URINALYSIS, ROUTINE W REFLEX MICROSCOPIC - Abnormal; Notable for the following components:   Hgb urine dipstick MODERATE (*)    Protein, ur 100 (*)    Bacteria, UA MANY (*)    Squamous Epithelial / LPF 0-5 (*)    All other components within normal limits  HEPATIC FUNCTION PANEL - Abnormal; Notable for the following components:   Bilirubin, Direct <0.1 (*)    All other components within normal limits  CBC  LIPASE, BLOOD  I-STAT BETA HCG BLOOD, ED ([facility], [facility], [facility] ONLY)  GC/CHLAMYDIA PROBE AMP ([place]) NOT AT [facility]    EKG  EKG  Interpretation None       Radiology US Transvaginal Non-ob  Result Date: 03/20/2017 CLINICAL DATA:  Right lower quadrant pain EXAM: TRANSABDOMINAL AND TRANSVAGINAL ULTRASOUND OF PELVIS DOPPLER ULTRASOUND OF OVARIES TECHNIQUE: Both transabdominal and transvaginal ultrasound examinations of the pelvis were performed. Transabdominal technique was performed for global imaging of the pelvis including uterus, ovaries, adnexal regions, and pelvic cul-de-sac. It was necessary to proceed with endovaginal exam following the transabdominal exam to visualize the ovaries and uterus. Color and duplex Doppler ultrasound was utilized to evaluate blood flow to the ovaries. COMPARISON:  CT 11/27/2008 FINDINGS: Uterus Measurements: 7.1 x 4.2 x 4.4 cm. No fibroids or other mass visualized. Endometrium Thickness: 5.8 mm.  No focal abnormality visualized. Right ovary Measurements: 3.6 x 1.8 x 2 cm. Normal appearance/no adnexal mass. Left ovary Measurements: 2.2 x 1.5 x 1.9 cm. Normal appearance/no adnexal mass. Pulsed Doppler evaluation of both ovaries demonstrates normal low-resistance arterial and venous waveforms. Other findings No abnormal free fluid. IMPRESSION: Negative pelvic ultrasound.  No evidence for ovarian torsion Electronically Signed   By: Jasmine Pang M.D.   On: 03/20/2017 19:23   US Pelvis Complete  Result Date: 03/20/2017 CLINICAL DATA:  Right lower quadrant pain EXAM: TRANSABDOMINAL AND TRANSVAGINAL ULTRASOUND OF PELVIS DOPPLER ULTRASOUND OF OVARIES TECHNIQUE: Both transabdominal and transvaginal ultrasound examinations of the pelvis were performed. Transabdominal technique was performed for global imaging of the pelvis including uterus, ovaries, adnexal regions, and pelvic cul-de-sac. It was necessary to proceed with endovaginal exam following the transabdominal exam to visualize the ovaries and uterus. Color and duplex Doppler ultrasound was utilized to evaluate blood flow to the ovaries. COMPARISON:   CT 11/27/2008 FINDINGS: Uterus Measurements: 7.1 x 4.2 x 4.4 cm. No fibroids or other mass visualized. Endometrium Thickness: 5.8 mm.  No focal abnormality visualized. Right ovary Measurements: 3.6 x 1.8 x 2 cm. Normal appearance/no adnexal mass. Left ovary Measurements: 2.2 x 1.5 x 1.9 cm. Normal appearance/no adnexal mass. Pulsed Doppler evaluation of both ovaries demonstrates normal low-resistance arterial and venous waveforms. Other findings No abnormal free fluid. IMPRESSION: Negative pelvic ultrasound.  No evidence for ovarian torsion Electronically Signed  By: Jasmine Pang M.D.   On: 03/20/2017 19:23   [place] Art/ven Flow Abd Pelv Doppler  Result Date: 03/20/2017 CLINICAL DATA:  Right lower quadrant pain EXAM: TRANSABDOMINAL AND TRANSVAGINAL ULTRASOUND OF PELVIS DOPPLER ULTRASOUND OF OVARIES TECHNIQUE: Both transabdominal and transvaginal ultrasound examinations of the pelvis were performed. Transabdominal technique was performed for global imaging of the pelvis including uterus, ovaries, adnexal regions, and pelvic cul-de-sac. It was necessary to proceed with endovaginal exam following the transabdominal exam to visualize the ovaries and uterus. Color and duplex Doppler ultrasound was utilized to evaluate blood flow to the ovaries. COMPARISON:  CT 11/27/2008 FINDINGS: Uterus Measurements: 7.1 x 4.2 x 4.4 cm. No fibroids or other mass visualized. Endometrium Thickness: 5.8 mm.  No focal abnormality visualized. Right ovary Measurements: 3.6 x 1.8 x 2 cm. Normal appearance/no adnexal mass. Left ovary Measurements: 2.2 x 1.5 x 1.9 cm. Normal appearance/no adnexal mass. Pulsed Doppler evaluation of both ovaries demonstrates normal low-resistance arterial and venous waveforms. Other findings No abnormal free fluid. IMPRESSION: Negative pelvic ultrasound.  No evidence for ovarian torsion Electronically Signed   By: Jasmine Pang M.D.   On: 03/20/2017 19:23   Ct Renal Stone Study  Result Date:  03/20/2017 CLINICAL DATA:  Right-sided flank pain. EXAM: CT ABDOMEN AND PELVIS WITHOUT CONTRAST TECHNIQUE: Multidetector CT imaging of the abdomen and pelvis was performed following the standard protocol without IV contrast. COMPARISON:  Pelvic ultrasound from the same date. FINDINGS: Lower chest: No acute abnormality. Hepatobiliary: Normal appearance of the liver. 5 mm calculus within the gallbladder neck. No evidence of biliary ductal dilation. Pancreas: Unremarkable. No pancreatic ductal dilatation or surrounding inflammatory changes. Spleen: Normal in size without focal abnormality. Adrenals/Urinary Tract: Adrenal glands are unremarkable. Kidneys are normal, without renal calculi, focal lesion, or hydronephrosis. Bladder is unremarkable. Stomach/Bowel: Stomach is within normal limits. Appendix appears normal. No evidence of bowel wall thickening, distention, or inflammatory changes. Vascular/Lymphatic: No significant vascular findings are present. No enlarged abdominal or pelvic lymph nodes. Reproductive: Uterus and bilateral adnexa are unremarkable. Fallopian tube closure devices seen. Other: Small amount of free fluid in the pelvis, possibly trivial. Musculoskeletal: Levoconvex thoraco lumbar spine scoliosis. Partially visualized posterior fusion of the thoracic spine. IMPRESSION: 5 mm calculus within the gallbladder neck. No evidence of biliary ductal distension, abnormal dilation of the gallbladder or pericholecystic fluid. No evidence of obstructive uropathy. Electronically Signed   By: Ted Mcalpine M.D.   On: 03/20/2017 20:30    Procedures Procedures (including critical care time)  Medications Ordered in ED Medications  oxyCODONE-acetaminophen (PERCOCET/ROXICET) 5-325 MG per tablet 1 tablet (1 tablet Oral Given 03/20/17 1656)  ondansetron (ZOFRAN-ODT) 4 MG disintegrating tablet (4 mg  Given 03/20/17 1657)  acetaminophen (TYLENOL) tablet 500 mg (500 mg Oral Given 03/20/17 1853)   ondansetron (ZOFRAN-ODT) disintegrating tablet 4 mg (4 mg Oral Given 03/20/17 2051)  oxyCODONE-acetaminophen (PERCOCET/ROXICET) 5-325 MG per tablet 1 tablet (1 tablet Oral Given 03/20/17 2050)     Initial Impression / Assessment and Plan / ED Course  I have reviewed the triage vital signs and the nursing notes.  Pertinent labs & imaging results that were available during my care of the patient were reviewed by me and considered in my medical decision making (see chart for details).     Pt presenting for N/V, abd pain and vaginal bleeding. Physical exam with TTP of abd and vaginal blood. Will order pregnancy and pelvic US to r/o ectopic or torsion. Percocet and zofran given for pain control.  Pregnancy negative, US negative for abnormality. Tried PO challenge and pt had return of pain. Will order CT renal to r/u stone. UA with blood without infection.   CT showed cholelithiasis blocking distal duct with dilation of gallbladder. LFTs and lipase labs ordered to r/o biliary pancreatitis and assess liver/bili. Discussed with attending, and Dr. Fayrene FearingJames agrees to plan.   Labs reassuring. Pt with biliary colic. Will d/c with rx for pain and nausea, pt to f/u with general surgery OP. At this time, pt appears safe for discharge. Return precautions give. Pt states she understands and agrees to plan.   Final Clinical Impressions(s) / ED Diagnoses   Final diagnoses:  Calculus of bile duct without cholecystitis and without obstruction    ED Discharge Orders        Ordered    ondansetron (ZOFRAN ODT) 4 MG disintegrating tablet  Every 8 hours PRN,   Status:  Discontinued     03/20/17 2138    morphine (MSIR) 30 MG tablet  Every 6 hours PRN,   Status:  Discontinued     03/20/17 2138    acetaminophen (TYLENOL) 325 MG tablet  3 times daily with meals,   Status:  Discontinued     03/20/17 2138    acetaminophen (TYLENOL) 325 MG tablet  3 times daily with meals     03/20/17 2141    morphine (MSIR) 30 MG  tablet  Every 6 hours PRN     03/20/17 2141    ondansetron (ZOFRAN ODT) 4 MG disintegrating tablet  Every 8 hours PRN     03/20/17 2141       Alveria ApleyCaccavale, Grae Cannata, PA-C 03/21/17 0138    Rolland PorterJames, Mark, MD 03/27/17 2217

## 2017-03-20 NOTE — ED Notes (Signed)
ED PA at bedside with tech conducting Pelvic Exam

## 2017-03-20 NOTE — ED Notes (Signed)
MADE FIRST URINE REQUEST 

## 2017-03-21 LAB — GC/CHLAMYDIA PROBE AMP (~~LOC~~) NOT AT ARMC
CHLAMYDIA, DNA PROBE: NEGATIVE
NEISSERIA GONORRHEA: NEGATIVE

## 2017-05-12 ENCOUNTER — Emergency Department (HOSPITAL_COMMUNITY): Payer: Self-pay

## 2017-05-12 ENCOUNTER — Emergency Department (HOSPITAL_COMMUNITY)
Admission: EM | Admit: 2017-05-12 | Discharge: 2017-05-12 | Disposition: A | Discharge status: Home / Self Care | Payer: Self-pay | Attending: Emergency Medicine | Admitting: Emergency Medicine

## 2017-05-12 ENCOUNTER — Encounter (HOSPITAL_COMMUNITY): Payer: Self-pay | Admitting: Emergency Medicine

## 2017-05-12 DIAGNOSIS — N3090 Cystitis, unspecified without hematuria: Secondary | ICD-10-CM | POA: Insufficient documentation

## 2017-05-12 DIAGNOSIS — K8050 Calculus of bile duct without cholangitis or cholecystitis without obstruction: Secondary | ICD-10-CM

## 2017-05-12 DIAGNOSIS — K8020 Calculus of gallbladder without cholecystitis without obstruction: Secondary | ICD-10-CM | POA: Insufficient documentation

## 2017-05-12 DIAGNOSIS — F17210 Nicotine dependence, cigarettes, uncomplicated: Secondary | ICD-10-CM | POA: Insufficient documentation

## 2017-05-12 DIAGNOSIS — R1032 Left lower quadrant pain: Secondary | ICD-10-CM | POA: Insufficient documentation

## 2017-05-12 LAB — COMPREHENSIVE METABOLIC PANEL
ALT: 19 U/L (ref 14–54)
AST: 24 U/L (ref 15–41)
Albumin: 3.9 g/dL (ref 3.5–5.0)
Alkaline Phosphatase: 62 U/L (ref 38–126)
Anion gap: 5 (ref 5–15)
BUN: 10 mg/dL (ref 6–20)
CO2: 24 mmol/L (ref 22–32)
Calcium: 8.5 mg/dL — ABNORMAL LOW (ref 8.9–10.3)
Chloride: 106 mmol/L (ref 101–111)
Creatinine, Ser: 0.56 mg/dL (ref 0.44–1.00)
Glucose, Bld: 104 mg/dL — ABNORMAL HIGH (ref 65–99)
POTASSIUM: 3.7 mmol/L (ref 3.5–5.1)
SODIUM: 135 mmol/L (ref 135–145)
Total Bilirubin: 0.5 mg/dL (ref 0.3–1.2)
Total Protein: 7.2 g/dL (ref 6.5–8.1)

## 2017-05-12 LAB — URINALYSIS, ROUTINE W REFLEX MICROSCOPIC
BILIRUBIN URINE: NEGATIVE
Glucose, UA: NEGATIVE mg/dL
KETONES UR: NEGATIVE mg/dL
Leukocytes, UA: NEGATIVE
Nitrite: POSITIVE — AB
PH: 6 (ref 5.0–8.0)
PROTEIN: NEGATIVE mg/dL
Specific Gravity, Urine: 1.021 (ref 1.005–1.030)

## 2017-05-12 LAB — CBC
HEMATOCRIT: 34.3 % — AB (ref 36.0–46.0)
HEMOGLOBIN: 11.3 g/dL — AB (ref 12.0–15.0)
MCH: 29.7 pg (ref 26.0–34.0)
MCHC: 32.9 g/dL (ref 30.0–36.0)
MCV: 90 fL (ref 78.0–100.0)
Platelets: 195 10*3/uL (ref 150–400)
RBC: 3.81 MIL/uL — AB (ref 3.87–5.11)
RDW: 14.3 % (ref 11.5–15.5)
WBC: 8.3 10*3/uL (ref 4.0–10.5)

## 2017-05-12 LAB — I-STAT BETA HCG BLOOD, ED (MC, WL, AP ONLY)

## 2017-05-12 LAB — LIPASE, BLOOD: LIPASE: 26 U/L (ref 11–51)

## 2017-05-12 MED ORDER — ONDANSETRON HCL 4 MG PO TABS: 4.0000 mg | ORAL_TABLET | Freq: Three times a day (TID) | ORAL | 0 refills | Status: AC | PRN

## 2017-05-12 MED ORDER — MORPHINE SULFATE (PF) 4 MG/ML IV SOLN
4.0000 mg | Freq: Once | INTRAVENOUS | Status: AC
Start: 2017-05-12 — End: 2017-05-12
  Administered 2017-05-12: 4 mg via INTRAVENOUS
  Filled 2017-05-12: qty 1

## 2017-05-12 MED ORDER — CEFTRIAXONE SODIUM 1 G IJ SOLR
1.0000 g | Freq: Once | INTRAMUSCULAR | Status: AC
  Administered 2017-05-12: 1 g via INTRAVENOUS
  Filled 2017-05-12: qty 10

## 2017-05-12 MED ORDER — CEPHALEXIN 500 MG PO CAPS: 500.0000 mg | ORAL_CAPSULE | Freq: Two times a day (BID) | ORAL | 0 refills | Status: AC

## 2017-05-12 MED ORDER — ONDANSETRON HCL 4 MG/2ML IJ SOLN
4.0000 mg | Freq: Once | INTRAMUSCULAR | Status: AC
  Administered 2017-05-12: 4 mg via INTRAVENOUS
  Filled 2017-05-12: qty 2

## 2017-05-12 MED ORDER — SODIUM CHLORIDE 0.9 % IV BOLUS (SEPSIS)
1000.0000 mL | Freq: Once | INTRAVENOUS | Status: AC
  Administered 2017-05-12: 1000 mL via INTRAVENOUS

## 2017-05-12 NOTE — ED Notes (Signed)
Pt aware urine sample is needed 

## 2017-05-12 NOTE — Discharge Instructions (Signed)
You were seen here today for abdominal pain and urinary symptoms.  Your lab work was reassuring. Your CT scan and showed that you have a gallstone that is stable from prior.  Please follow-up with [facility] for this.  Attached handout on cholelithiasis. Your urine was questional for a urinary tract infection.  I am treating him with Keflex for this. Please follow with your PCP for further evaluation. If you develop worsening or new concerning symptoms you can return to the emergency department for re-evaluation.

## 2017-05-12 NOTE — ED Notes (Signed)
Pt ambulatory and independent at discharge.  Verbalized understanding of discharge instructions 

## 2017-05-12 NOTE — ED Notes (Signed)
Patient transported to CT 

## 2017-05-12 NOTE — ED Triage Notes (Signed)
Patient c/o RUQ abdominal pain with vomiting x "months." Reports she was seen and dx with gall stone in bile duct but unable to follow up properly due to lack of insurance. Also c/o left side flank pain x 1 week. Reports hx pyelonephritis.

## 2017-05-12 NOTE — ED Provider Notes (Signed)
[facility]-EMERGENCY DEPT Provider Note   CSN: [id number] Arrival date & time: 05/12/17  1447     History   Chief Complaint Chief Complaint  Patient presents with  . Abdominal Pain    HPI Debra Jensen is a 27 y.o. female with a history of cholelithiasis who presents the emergency department today for left flank pain and right upper quadrant abdominal pain.    RUQ Abdominal Pain Patient notes that she was seen here on 03/20/2017 and diagnosed with 5 mm calculus within the gallbladder neck.  There was no evidence of cholecystitis at that time.  She was given a referral over to [facility].  She states that she has not been able to follow-up due to the cost.  She notes that since being discharged she has been having recurrent right upper quadrant abdominal pain that occurs after large lines of oral intake.  She notes her pain is cramping in nature, describing this as "as if I was delivering a baby".  The pain does have radiation to the patient's back but not to the lower abdomen.  She notices last several hours and usually is associated with 1-2 episodes of nonbilious, nonbloody emesis.  The patient has not been taking anything for this as she has not no what to take.  She notes her last episode of emesis was yesterday.  Last bowel movement was yesterday and normal.  No diarrhea, melena or hematochezia.  Patient has no previous abdominal surgeries.  She denies any fever, chills. LMP 04/23/17. Last episode of pain occurred 5 minutes ago after patient drank juice.   Left Flank Pain Patient notes that she had the onset of left flank pain without radiation when she woke on Saturday morning.  Patient reports that she went out and drank heavily on Friday night and thinks that she may have "hurt her kidney".  She notes the pain is constant, and no matter how she positions so she cannot become comfortable.  The pain is worsened by movement, palpation and deep breathing.   She does report some associated decrease in urine output. There is no associated dysuria, suprapubic pain, frequency, urgency, or hematuria.  She has not taken anything for this.  No chest pain. Denies risk factors for DVT/PE including exogenous estrogen use, recent surgery or travel, trauma, immobilization,  previous blood clot, cough, hemoptysis, cancer, lower extremity pain or swelling, or family history of bleeding/clotting disorder.   HPI  Past Medical History:  Diagnosis Date  . Anemia    with preg  . Back pain, chronic   . Chlamydia   . GERD (gastroesophageal reflux disease)   . Headache(784.0)   . Reflux   . Scoliosis   . Urinary tract infection     Patient Active Problem List   Diagnosis Date Noted  . S/P Repeat Cesarean Section and BTS 08/12/2012  . Low back pain 07/09/2012  . Desires Sterilization 07/09/2012  . Eczema 07/09/2012  . Drug use complicating pregnancy 06/17/2012  . Previous cesarean delivery, antepartum condition or complication 04/10/2012  . Supervision of high risk pregnancy in second trimester 04/07/2012  . Scoliosis 04/07/2012    Past Surgical History:  Procedure Laterality Date  . BACK SURGERY     harrington rods  . CESAREAN SECTION    . CESAREAN SECTION N/A 08/12/2012   Procedure: CESAREAN SECTION;  Surgeon: Tereso Newcomer, MD;  Location: [facility] ORS;  Service: Obstetrics;  Laterality: N/A;  . TUBAL LIGATION  08/12/2012  Procedure: BILATERAL TUBAL LIGATION;  Surgeon: Tereso NewcomerUgonna A Anyanwu, MD;  Location: [facility] ORS;  Service: Obstetrics;;    OB History    Gravida Para Term Preterm AB Living   3 3 3  0 0 3   SAB TAB Ectopic Multiple Live Births   0 0 0 0 3       Home Medications    Prior to Admission medications   Medication Sig Start Date End Date Taking? Authorizing Provider  acetaminophen (TYLENOL) 325 MG tablet Take 2 tablets (650 mg total) by mouth 3 (three) times daily with meals. 03/20/17   Caccavale, Sophia, PA-C  acetaminophen-codeine  (TYLENOL #3) 300-30 MG tablet Take 1-2 tablets by mouth every 6 (six) hours as needed for moderate pain. Patient not taking: Reported on 03/20/2017 12/28/15   Dowless, Lelon MastSamantha Tripp, PA-C  albuterol (PROVENTIL HFA;VENTOLIN HFA) 108 (90 BASE) MCG/ACT inhaler Inhale 2 puffs into the lungs every 4 (four) hours as needed for wheezing or shortness of breath. 01/10/14   Ward, Layla MawKristen N, DO  aspirin-acetaminophen-caffeine (EXCEDRIN MIGRAINE) [phone] MG tablet Take 2 tablets by mouth every 6 (six) hours as needed for headache.    [provider]  cyclobenzaprine (FLEXERIL) 10 MG tablet Take 1 tablet (10 mg total) by mouth 2 (two) times daily as needed for muscle spasms. Patient not taking: Reported on 03/20/2017 12/28/15   Dowless, Lelon MastSamantha Tripp, PA-C  dextromethorphan-guaiFENesin (MUCINEX DM) 30-600 MG per 12 hr tablet Take 1 tablet by mouth 2 (two) times daily as needed for cough (for mucus).    [provider]  HYDROcodone-acetaminophen (NORCO/VICODIN) 5-325 MG tablet Take 1-2 tablets by mouth every 4 (four) hours as needed. Patient not taking: Reported on 03/20/2017 05/13/15   Santiago GladLaisure, Heather, PA-C  HYDROcodone-acetaminophen (NORCO/VICODIN) 5-325 MG tablet Take 1 tablet by mouth every 4 (four) hours as needed. Patient not taking: Reported on 03/20/2017 07/09/15   Sam, Ace GinsSerena Y, PA-C  ibuprofen (ADVIL,MOTRIN) 200 MG tablet Take 600 mg by mouth every 6 (six) hours as needed for cramping.    [provider]  morphine (MSIR) 30 MG tablet Take 1 tablet (30 mg total) by mouth every 6 (six) hours as needed for severe pain. 03/20/17   Caccavale, Sophia, PA-C  ondansetron (ZOFRAN ODT) 4 MG disintegrating tablet Take 1 tablet (4 mg total) by mouth every 8 (eight) hours as needed for nausea or vomiting. 03/20/17   Caccavale, Sophia, PA-C  Pseudoephedrine HCl (NASAL DECONGESTANT ADULT PO) Take 1 tablet by mouth daily as needed (for congestion).    [provider]  traMADol (ULTRAM)  50 MG tablet Take 1 tablet (50 mg total) by mouth every 6 (six) hours as needed. Patient not taking: Reported on 03/20/2017 05/13/15   Santiago GladLaisure, Heather, PA-C    Family History Family History  Problem Relation Age of Onset  . Hypertension Other   . Hypertension Mother   . Cancer Mother   . Cancer Paternal Uncle   . Other Neg Hx     Social History Social History   Tobacco Use  . Smoking status: Current Every Day Smoker    Packs/day: 0.25    Years: 4.00    Pack years: 1.00    Types: Cigarettes  . Smokeless tobacco: Never Used  Substance Use Topics  . Alcohol use: Yes    Comment: occasional/social  . Drug use: No     Allergies   Motrin [ibuprofen] and Naproxen   Review of Systems Review of Systems  All other systems reviewed and  are negative.    Physical Exam Updated Vital Signs BP 105/75 (BP Location: Left Arm)   Pulse 99   Temp 98.7 F (37.1 C) (Oral)   Resp 20   LMP 04/23/2017   SpO2 100%   Physical Exam  Constitutional: She appears well-developed and well-nourished.  Writhing in pain.  Appears uncomfortable.  HENT:  Head: Normocephalic and atraumatic.  Right Ear: External ear normal.  Left Ear: External ear normal.  Nose: Nose normal.  Mouth/Throat: Uvula is midline, oropharynx is clear and moist and mucous membranes are normal. No tonsillar exudate.  Eyes: Pupils are equal, round, and reactive to light. Right eye exhibits no discharge. Left eye exhibits no discharge. No scleral icterus.  Neck: Trachea normal. Neck supple. No spinous process tenderness present. No neck rigidity. Normal range of motion present.  Cardiovascular: Normal rate, regular rhythm and intact distal pulses.  No murmur heard. Pulses:      Radial pulses are 2+ on the right side, and 2+ on the left side.       Dorsalis pedis pulses are 2+ on the right side, and 2+ on the left side.       Posterior tibial pulses are 2+ on the right side, and 2+ on the left side.  No lower extremity  swelling or edema. Calves symmetric in size bilaterally.  Pulmonary/Chest: Effort normal and breath sounds normal.     She exhibits tenderness.  No increased work of breathing. No accessory muscle use. Patient is sitting upright, speaking in full sentences without difficulty   Abdominal: Soft. Bowel sounds are normal. She exhibits no distension and no pulsatile midline mass. There is tenderness in the right upper quadrant and epigastric area. There is CVA tenderness (left) and positive Murphy's sign. There is no rigidity, no rebound and no guarding.  Musculoskeletal: She exhibits no edema.  Lymphadenopathy:    She has no cervical adenopathy.  Neurological: She is alert.  Skin: Skin is warm and dry. No rash noted. She is not diaphoretic.  No jaundice  Psychiatric: She has a normal mood and affect.  Nursing note and vitals reviewed.    ED Treatments / Results  Labs (all labs ordered are listed, but only abnormal results are displayed) Labs Reviewed  COMPREHENSIVE METABOLIC PANEL - Abnormal; Notable for the following components:      Result Value   Glucose, Bld 104 (*)    Calcium 8.5 (*)    All other components within normal limits  CBC - Abnormal; Notable for the following components:   RBC 3.81 (*)    Hemoglobin 11.3 (*)    HCT 34.3 (*)    All other components within normal limits  URINALYSIS, ROUTINE W REFLEX MICROSCOPIC - Abnormal; Notable for the following components:   Color, Urine AMBER (*)    APPearance HAZY (*)    Hgb urine dipstick SMALL (*)    Nitrite POSITIVE (*)    Bacteria, UA MANY (*)    Squamous Epithelial / LPF 6-30 (*)    All other components within normal limits  URINE CULTURE  LIPASE, BLOOD  I-STAT BETA HCG BLOOD, ED ([facility], [facility], [facility] ONLY)    EKG  EKG Interpretation None       Radiology Dg Chest 2 View  Result Date: 05/12/2017 CLINICAL DATA:  Patient c/o RUQ abdominal pain with vomiting x "months." Reports she was seen and dx with gall stone in bile  duct but unable to follow up properly due to lack of insurance. Also  c/o left side flank pain x 1 week. Reports hx pyelo.*comment was truncated* EXAM: CHEST  2 VIEW COMPARISON:  01/10/2014 FINDINGS: Posterior fusion rods. Dextroscoliosis of the thoracic spine. No effusion, infiltrate or pneumothorax. No acute osseous abnormality. IMPRESSION: No acute cardiopulmonary process. Electronically Signed   By: Genevive Bi M.D.   On: 05/12/2017 19:53   Ct Renal Stone Study  Result Date: 05/12/2017 CLINICAL DATA:  Right upper quadrant pain with vomiting for several months, left flank pain for 1 week EXAM: CT ABDOMEN AND PELVIS WITHOUT CONTRAST TECHNIQUE: Multidetector CT imaging of the abdomen and pelvis was performed following the standard protocol without IV contrast. COMPARISON:  CT 03/20/2017, 11/28/2008 FINDINGS: Lower chest: Lung bases show linear scarring or atelectasis at the left base. The heart size is normal Hepatobiliary: Small calcified stone in the region of gallbladder neck as before. No wall thickening or surrounding fluid. No focal hepatic abnormality or biliary distention Pancreas: Unremarkable. No pancreatic ductal dilatation or surrounding inflammatory changes. Spleen: Normal in size without focal abnormality. Adrenals/Urinary Tract: Adrenal glands are unremarkable. Kidneys are normal, without renal calculi, focal lesion, or hydronephrosis. Bladder is unremarkable. Stomach/Bowel: Stomach is within normal limits. Appendix appears normal. No evidence of bowel wall thickening, distention, or inflammatory changes. Vascular/Lymphatic: No significant vascular findings are present. No enlarged abdominal or pelvic lymph nodes. Reproductive: Bilateral ligation clips. Uterus unremarkable. No adnexal mass. Other: Negative for free air or free fluid. Musculoskeletal: Partially visualized stabilization rods and fixating screws within the thoracic spine. Chronic deformity of left iliac bone. IMPRESSION: 1.  Negative for nephrolithiasis, hydronephrosis or ureteral stone 2. Stable small calcified stone in the region of gallbladder neck. Negative for wall thickening, gallbladder distention, or biliary dilatation Electronically Signed   By: Jasmine Pang M.D.   On: 05/12/2017 18:41    Procedures Procedures (including critical care time)  Medications Ordered in ED Medications  sodium chloride 0.9 % bolus 1,000 mL (0 mLs Intravenous Stopped 05/12/17 2109)  ondansetron (ZOFRAN) injection 4 mg (4 mg Intravenous Given 05/12/17 1851)  morphine 4 MG/ML injection 4 mg (4 mg Intravenous Given 05/12/17 1854)  morphine 4 MG/ML injection 4 mg (4 mg Intravenous Given 05/12/17 2108)  cefTRIAXone (ROCEPHIN) 1 g in dextrose 5 % 50 mL IVPB (0 g Intravenous Stopped 05/12/17 2252)     Initial Impression / Assessment and Plan / ED Course  I have reviewed the triage vital signs and the nursing notes.  Pertinent labs & imaging results that were available during my care of the patient were reviewed by me and considered in my medical decision making (see chart for details).      27 y.o. female with a history of cholelithiasis who presents the emergency department today for left flank pain with associated decreased urine output and daily right upper quadrant abdominal pain that occurs after eating and is associated nausea and emesis.    On presentation the patient is without fever, tachycardia, tachypnea, hypoxia or hypotension.  Patient is writhing in pain and appears uncomfortable.  Heart is regular rate and rhythm.  Lungs are clear to auscultation bilaterally.  She does have CVA tenderness of the left.  There is right upper quadrant tenderness to palpation as well without peritoneal signs.  Will obtain lab work and CT renal stone study to evaluate.  Will give IV fluids, Zofran and morphine.  Pregnancy test negative. Doubt pain due to ectopic. Lipase within normal limits.  Electrolytes are within normal limits.  Creatinine  within normal limits.  No evidence of AKI.  LFTs without elevation.  Total bilirubin 0.5.  No anion gap acidosis.  No leukocytosis.  CT renal stone study shows no evidence of nephrolithiasis, hydronephrosis or ureteral stone. Gallbladder also examined and showed there was stable small calcified stone in the region of the gallbladder neck.  There is no wall thickening, gallbladder distention or biliary dilation.  UA questionable for UTI with positive nitrates and many bacteria. Will send for Cx.   Given the patient has normal LFTs, no leukocytosis, no wall thickening, gallbladder distention or biliary dilation do not suspect cholecystitis at this time.  Discussed the patient will need to follow-up with [facility] surgery for further evaluation of this.  Patient's renal stone study negative for stone. It is possible to miss small stone however and there is small hgb in urine. Patient urine questionable for uti. She was given rocephin in the department and will be discharged on keflex. Pain is currently controlled. She has had no episodes of emesis in the department. Tolerated PO challenge. She is without RF's for PE, and is sating at 99% with HR of 78. Do not feel patient needs further evaluation of pain with CTA. CXR negative. Will treat the patient symptomatically for her pain. I advised the patient to follow-up with PCP this week. Specific return precautions discussed. Time was given for all questions to be answered. The patient verbalized understanding and agreement with plan. The patient appears safe for discharge home. Patient case seen and discussed with Dr. Adriana Simas who is in agreement with plan.   Final Clinical Impressions(s) / ED Diagnoses   Final diagnoses:  Cystitis  Calculus of bile duct without cholecystitis and without obstruction    ED Discharge Orders        Ordered    cephALEXin (KEFLEX) 500 MG capsule  2 times daily     05/12/17 2155    ondansetron (ZOFRAN) 4 MG tablet  Every 8  hours PRN     05/12/17 2155       Princella Pellegrini 05/12/17 2319    Donnetta Hutching, MD 05/15/17 1022

## 2017-05-15 LAB — URINE CULTURE: Culture: >=100000 — AB

## 2017-05-16 ENCOUNTER — Telehealth: Payer: Self-pay | Admitting: Emergency Medicine

## 2017-05-16 NOTE — Telephone Encounter (Signed)
Post ED Visit - Positive Culture Follow-up  Culture report reviewed by antimicrobial stewardship pharmacist:  [x]  Enzo BiNathan Batchelder, Pharm.D. []  Celedonio MiyamotoJeremy Frens, Pharm.D., BCPS AQ-ID []  Garvin FilaMike Maccia, Pharm.D., BCPS []  Georgina PillionElizabeth Martin, Pharm.D., BCPS []  [place]h Pham, VermontPharm.D., BCPS, AAHIVP []  Estella HuskMichelle Turner, Pharm.D., BCPS, AAHIVP []  Lysle Pearlachel Rumbarger, PharmD, BCPS []  Blake DivineShannon Parkey, PharmD []  Pollyann SamplesAndy Johnston, PharmD, BCPS  Positive urine culture Treated with cephalexin, organism sensitive to the same and no further patient follow-up is required at this time.  Berle MullMiller, Jhoanna Heyde 05/16/2017, 11:50 AM

## 2017-08-20 ENCOUNTER — Emergency Department (HOSPITAL_COMMUNITY): Payer: Self-pay

## 2017-08-20 ENCOUNTER — Encounter (HOSPITAL_COMMUNITY): Payer: Self-pay | Admitting: Emergency Medicine

## 2017-08-20 ENCOUNTER — Emergency Department (HOSPITAL_COMMUNITY)
Admission: EM | Admit: 2017-08-20 | Discharge: 2017-08-20 | Disposition: A | Discharge status: Home / Self Care | Payer: Self-pay | Attending: Emergency Medicine | Admitting: Emergency Medicine

## 2017-08-20 DIAGNOSIS — F17210 Nicotine dependence, cigarettes, uncomplicated: Secondary | ICD-10-CM | POA: Insufficient documentation

## 2017-08-20 DIAGNOSIS — K047 Periapical abscess without sinus: Secondary | ICD-10-CM | POA: Insufficient documentation

## 2017-08-20 LAB — CBC WITH DIFFERENTIAL/PLATELET
BASOS ABS: 0 10*3/uL (ref 0.0–0.1)
Basophils Relative: 0 %
Eosinophils Absolute: 0.3 10*3/uL (ref 0.0–0.7)
Eosinophils Relative: 3 %
HEMATOCRIT: 37.7 % (ref 36.0–46.0)
Hemoglobin: 12.3 g/dL (ref 12.0–15.0)
LYMPHS PCT: 26 %
Lymphs Abs: 2.4 10*3/uL (ref 0.7–4.0)
MCH: 29.1 pg (ref 26.0–34.0)
MCHC: 32.6 g/dL (ref 30.0–36.0)
MCV: 89.3 fL (ref 78.0–100.0)
Monocytes Absolute: 0.7 10*3/uL (ref 0.1–1.0)
Monocytes Relative: 8 %
NEUTROS ABS: 5.8 10*3/uL (ref 1.7–7.7)
NEUTROS PCT: 63 %
Platelets: 217 10*3/uL (ref 150–400)
RBC: 4.22 MIL/uL (ref 3.87–5.11)
RDW: 13.5 % (ref 11.5–15.5)
WBC: 9.1 10*3/uL (ref 4.0–10.5)

## 2017-08-20 LAB — BASIC METABOLIC PANEL
ANION GAP: 8 (ref 5–15)
BUN: 5 mg/dL — ABNORMAL LOW (ref 6–20)
CO2: 25 mmol/L (ref 22–32)
Calcium: 9.3 mg/dL (ref 8.9–10.3)
Chloride: 104 mmol/L (ref 101–111)
Creatinine, Ser: 0.64 mg/dL (ref 0.44–1.00)
GLUCOSE: 100 mg/dL — AB (ref 65–99)
Potassium: 3.8 mmol/L (ref 3.5–5.1)
Sodium: 137 mmol/L (ref 135–145)

## 2017-08-20 LAB — I-STAT BETA HCG BLOOD, ED (MC, WL, AP ONLY): I-stat hCG, quantitative: <5 m[IU]/mL (ref <5)

## 2017-08-20 MED ORDER — AMOXICILLIN-POT CLAVULANATE 875-125 MG PO TABS: 1.0000 | ORAL_TABLET | Freq: Two times a day (BID) | ORAL | 0 refills | Status: AC

## 2017-08-20 MED ORDER — IOHEXOL 300 MG/ML  SOLN
75.0000 mL | Freq: Once | INTRAMUSCULAR | Status: AC | PRN
  Administered 2017-08-20: 75 mL via INTRAVENOUS

## 2017-08-20 MED ORDER — AMOXICILLIN-POT CLAVULANATE 875-125 MG PO TABS
1.0000 | ORAL_TABLET | Freq: Once | ORAL | Status: AC
  Administered 2017-08-20: 1 via ORAL
  Filled 2017-08-20: qty 1

## 2017-08-20 MED ORDER — HYDROMORPHONE HCL 2 MG/ML IJ SOLN
0.5000 mg | Freq: Once | INTRAMUSCULAR | Status: AC
  Administered 2017-08-20: 0.5 mg via INTRAVENOUS
  Filled 2017-08-20: qty 1

## 2017-08-20 MED ORDER — MORPHINE SULFATE (PF) 4 MG/ML IV SOLN: 4.0000 mg | Freq: Once | INTRAVENOUS | Status: DC

## 2017-08-20 MED ORDER — OXYCODONE-ACETAMINOPHEN 5-325 MG PO TABS: 1.0000 | ORAL_TABLET | Freq: Once | ORAL | Status: DC

## 2017-08-20 MED ORDER — OXYCODONE-ACETAMINOPHEN 5-325 MG PO TABS: 1.0000 | ORAL_TABLET | Freq: Four times a day (QID) | ORAL | 0 refills | Status: AC | PRN

## 2017-08-20 NOTE — ED Provider Notes (Signed)
[facility] EMERGENCY DEPARTMENT Provider Note   CSN: [id number] Arrival date & time: 08/20/17  1556     History   Chief Complaint Chief Complaint  Patient presents with  . Facial Swelling    HPI Debra Jensen is a 27 y.o. female.  HPI   27 year old female presents today with complaints of facial swelling.  Patient reports 2 days ago she started to have swelling and pain in her chin and anterior teeth.  Patient notes pain with range of motion of the jaw, notes swelling of the chin, she denies any fever chills.  No swelling of the neck or the floor the mouth.  No medications prior to arrival.  Past Medical History:  Diagnosis Date  . Anemia    with preg  . Back pain, chronic   . Chlamydia   . GERD (gastroesophageal reflux disease)   . Headache(784.0)   . Reflux   . Scoliosis   . Urinary tract infection     Patient Active Problem List   Diagnosis Date Noted  . S/P Repeat Cesarean Section and BTS 08/12/2012  . Low back pain 07/09/2012  . Desires Sterilization 07/09/2012  . Eczema 07/09/2012  . Drug use complicating pregnancy 06/17/2012  . Previous cesarean delivery, antepartum condition or complication 04/10/2012  . Supervision of high risk pregnancy in second trimester 04/07/2012  . Scoliosis 04/07/2012    Past Surgical History:  Procedure Laterality Date  . BACK SURGERY     harrington rods  . CESAREAN SECTION    . CESAREAN SECTION N/A 08/12/2012   Procedure: CESAREAN SECTION;  Surgeon: Tereso Newcomer, MD;  Location: [facility];  Service: Obstetrics;  Laterality: N/A;  . TUBAL LIGATION  08/12/2012   Procedure: BILATERAL TUBAL LIGATION;  Surgeon: Tereso Newcomer, MD;  Location: [facility];  Service: Obstetrics;;     OB History    Gravida  3   Para  3   Term  3   Preterm  0   AB  0   Living  3     SAB  0   TAB  0   Ectopic  0   Multiple  0   Live Births  3            Home Medications    Prior to Admission medications     Medication Sig Start Date End Date Taking? Authorizing Provider  acetaminophen (TYLENOL) 325 MG tablet Take 2 tablets (650 mg total) by mouth 3 (three) times daily with meals. Patient not taking: Reported on 05/12/2017 03/20/17   Caccavale, Sophia, PA-C  acetaminophen-codeine (TYLENOL #3) 300-30 MG tablet Take 1-2 tablets by mouth every 6 (six) hours as needed for moderate pain. Patient not taking: Reported on 03/20/2017 12/28/15   Dowless, Lelon Mast Tripp, PA-C  albuterol (PROVENTIL HFA;VENTOLIN HFA) 108 (90 BASE) MCG/ACT inhaler Inhale 2 puffs into the lungs every 4 (four) hours as needed for wheezing or shortness of breath. Patient not taking: Reported on 05/12/2017 01/10/14   Ward, Layla Maw, DO  amoxicillin-clavulanate (AUGMENTIN) 875-125 MG tablet Take 1 tablet by mouth every 12 (twelve) hours. 08/20/17   Juddson Cobern, Tinnie Gens, PA-C  amoxicillin-clavulanate (AUGMENTIN) 875-125 MG tablet Take 1 tablet by mouth every 12 (twelve) hours. 08/20/17   Trinady Milewski, Tinnie Gens, PA-C  cephALEXin (KEFLEX) 500 MG capsule Take 1 capsule (500 mg total) by mouth 2 (two) times daily. 05/12/17   Maczis, Elmer Sow, PA-C  cyclobenzaprine (FLEXERIL) 10 MG tablet Take 1 tablet (10 mg total)  by mouth 2 (two) times daily as needed for muscle spasms. Patient not taking: Reported on 03/20/2017 12/28/15   Dowless, Lester Kinsman, PA-C  HYDROcodone-acetaminophen (NORCO/VICODIN) 5-325 MG tablet Take 1-2 tablets by mouth every 4 (four) hours as needed. Patient not taking: Reported on 03/20/2017 05/13/15   Santiago Glad, PA-C  HYDROcodone-acetaminophen (NORCO/VICODIN) 5-325 MG tablet Take 1 tablet by mouth every 4 (four) hours as needed. Patient not taking: Reported on 03/20/2017 07/09/15   Sam, Ace Gins, PA-C  morphine (MSIR) 30 MG tablet Take 1 tablet (30 mg total) by mouth every 6 (six) hours as needed for severe pain. Patient not taking: Reported on 05/12/2017 03/20/17   Caccavale, Sophia, PA-C  ondansetron (ZOFRAN ODT) 4 MG disintegrating  tablet Take 1 tablet (4 mg total) by mouth every 8 (eight) hours as needed for nausea or vomiting. Patient not taking: Reported on 05/12/2017 03/20/17   Caccavale, Sophia, PA-C  ondansetron (ZOFRAN) 4 MG tablet Take 1 tablet (4 mg total) by mouth every 8 (eight) hours as needed for nausea or vomiting. 05/12/17   Maczis, Elmer Sow, PA-C  oxyCODONE-acetaminophen (PERCOCET/ROXICET) 5-325 MG tablet Take 1 tablet by mouth every 6 (six) hours as needed for severe pain. 08/20/17   Meir Elwood, Tinnie Gens, PA-C  traMADol (ULTRAM) 50 MG tablet Take 1 tablet (50 mg total) by mouth every 6 (six) hours as needed. Patient not taking: Reported on 03/20/2017 05/13/15   Santiago Glad, PA-C    Family History Family History  Problem Relation Age of Onset  . Hypertension Other   . Hypertension Mother   . Cancer Mother   . Cancer Paternal Uncle   . Other Neg Hx     Social History Social History   Tobacco Use  . Smoking status: Current Every Day Smoker    Packs/day: 0.25    Years: 4.00    Pack years: 1.00    Types: Cigarettes  . Smokeless tobacco: Never Used  Substance Use Topics  . Alcohol use: Yes    Comment: occasional/social  . Drug use: No     Allergies   Motrin [ibuprofen] and Naproxen   Review of Systems Review of Systems  All other systems reviewed and are negative.  Physical Exam Updated Vital Signs BP 110/85   Pulse 89   Temp 98.9 F (37.2 C) (Oral)   Resp 20   LMP 08/05/2017   SpO2 99%   Physical Exam  Constitutional: She is oriented to person, place, and time. She appears well-developed and well-nourished.  HENT:  Head: Normocephalic and atraumatic.  Swelling noted to the chin-induration noted along the anterior lateral gumline-floor the mouth is soft, jaw full active range of motion neck supple nontender with no swelling or edema  Eyes: Pupils are equal, round, and reactive to light. Conjunctivae are normal. Right eye exhibits no discharge. Left eye exhibits no discharge. No  scleral icterus.  Neck: Normal range of motion. No JVD present. No tracheal deviation present.  Pulmonary/Chest: Effort normal. No stridor.  Neurological: She is alert and oriented to person, place, and time. Coordination normal.  Psychiatric: She has a normal mood and affect. Her behavior is normal. Judgment and thought content normal.  Nursing note and vitals reviewed.   ED Treatments / Results  Labs (all labs ordered are listed, but only abnormal results are displayed) Labs Reviewed  BASIC METABOLIC PANEL - Abnormal; Notable for the following components:      Result Value   Glucose, Bld 100 (*)    BUN 5 (*)  All other components within normal limits  CBC WITH DIFFERENTIAL/PLATELET  I-STAT BETA HCG BLOOD, ED ([facility], [facility], [facility] ONLY)    EKG None  Radiology Ct Maxillofacial W Contrast  Result Date: 08/20/2017 CLINICAL DATA:  Painful chin swelling for 3 days. EXAM: CT MAXILLOFACIAL WITH CONTRAST TECHNIQUE: Multidetector CT imaging of the maxillofacial structures was performed with intravenous contrast. Multiplanar CT image reconstructions were also generated. CONTRAST:  75mL OMNIPAQUE IOHEXOL 300 MG/ML  SOLN COMPARISON:  CT maxillofacial March 25, 2015 FINDINGS: OSSEOUS: Dental carie and periapical abscess tooth 22. Multiple absent teeth and dental caries. No acute facial fracture. ORBITS: Ocular globes and orbital contents are normal. SINUSES: Paranasal sinuses are well aerated. Nasal septum mildly deviated to the LEFT. Included mastoid aircells are well aerated. SOFT TISSUES: LEFT parasymphyseal mandible 8 x 15 x 19 mm subperiosteal rim enhancing fluid collection. Associated soft tissue swelling, subcutaneous fat stranding. No subcutaneous gas, radiopaque foreign bodies. Tongue piercing. Small reactive submental lymph nodes without lymphadenopathy by CT size criteria. No floor mouth edema. LIMITED INTRACRANIAL: Normal. IMPRESSION: 1. Tooth 22 periapical abscess with associated 8 x 15 x 19 mm  subperiosteal abscess. Lower face cellulitis. Electronically Signed   By: Awilda Metro M.D.   On: 08/20/2017 18:15    Procedures .Marland KitchenIncision and Drainage Date/Time: 08/20/2017 9:20 PM Performed by: Eyvonne Mechanic, PA-C Authorized by: Eyvonne Mechanic, PA-C   Consent:    Consent obtained:  Verbal   Consent given by:  Patient   Risks discussed:  Incomplete drainage, infection, damage to other organs, bleeding and pain   Alternatives discussed:  No treatment, delayed treatment and alternative treatment Location:    Type:  Abscess   Size:  1.5   Location:  Head   Head location:  Face Pre-procedure details:    Skin preparation:  Chloraprep Anesthesia (see MAR for exact dosages):    Anesthesia method:  Local infiltration and nerve block   Local anesthetic:  Bupivacaine 0.5% WITH epi   Block location:  Inferior alveolar    Block needle gauge:  25 G   Block anesthetic:  Bupivacaine 0.5% WITH epi   Block injection procedure:  Anatomic landmarks identified, introduced needle, anatomic landmarks palpated and incremental injection   Block outcome:  Incomplete block   (including critical care time)    Medications Ordered in ED Medications  iohexol (OMNIPAQUE) 300 MG/ML solution 75 mL (75 mLs Intravenous Contrast Given 08/20/17 1750)  HYDROmorphone (DILAUDID) injection 0.5 mg (0.5 mg Intravenous Given 08/20/17 1914)  HYDROmorphone (DILAUDID) injection 0.5 mg (0.5 mg Intravenous Given 08/20/17 2106)  amoxicillin-clavulanate (AUGMENTIN) 875-125 MG per tablet 1 tablet (1 tablet Oral Given 08/20/17 2105)     Initial Impression / Assessment and Plan / ED Course  I have reviewed the triage vital signs and the nursing notes.  Pertinent labs & imaging results that were available during my care of the patient were reviewed by me and considered in my medical decision making (see chart for details).     Labs: I-STAT beta-hCG, CBC, BMP  Imaging: CT maxillofacial with  contrast  Consults:  Therapeutics: Dilaudid, bupivacaine with epi, Augmentin  Discharge Meds: Percocet, Augmentin  Assessment/Plan: 27 year old female presents today with dental abscess.  Patient afebrile normal white count.  CT shows periapical abscess that tooth 22 with associated abscess in the subperiosteal region.  Due to ongoing pain and location dental block was performed, and complete anesthesia was noted.  Some local anesthesia was placed over the site of the abscess.  Patient was  very uncomfortable, this was a difficult procedure, and I was unable to access the abscess from the mucosal side.  Upon inspection of the chin she had fluctuance, this was cleansed with ChloraPrep with a very small incision which produced purulent discharge.  Patient will be placed on Augmentin, she will follow-up as an outpatient for ongoing evaluation and management with dental resources, she will return if any new or worsening signs or symptoms present or do not improve.  She verbalized understanding and agreement to today's plan had no further questions or concerns      Final Clinical Impressions(s) / ED Diagnoses   Final diagnoses:  Dental abscess    ED Discharge Orders        Ordered    amoxicillin-clavulanate (AUGMENTIN) 875-125 MG tablet  Every 12 hours     08/20/17 2046    oxyCODONE-acetaminophen (PERCOCET/ROXICET) 5-325 MG tablet  Every 6 hours PRN     08/20/17 2047    amoxicillin-clavulanate (AUGMENTIN) 875-125 MG tablet  Every 12 hours     08/20/17 2053       Eyvonne Mechanic, PA-C 08/20/17 2124    Melene Plan, DO 08/20/17 2134

## 2017-08-20 NOTE — ED Provider Notes (Signed)
Patient placed in Quick Look pathway, seen and evaluated   Chief Complaint: Chin swelling and pain x2 days  HPI:   Pt is a 27 y.o. female with a PMHx of chronic back pain, anemia, GERD, headaches, and scoliosis, presenting today with c/o 2 days of chin swelling and pain, she initially had dental pain but then that kind of resolved and then she started having swelling and pain in her jaw.  She is having some trismus and drooling, and reports some induration and erythema of the chin.  She denies any fevers.  ROS: +chin pain/swelling, +redness, +induration. No fevers  Physical Exam:  BP (!) 180/153 (BP Location: Right Arm)   Pulse (!) 104   Temp 98.9 F (37.2 C) (Oral)   Resp 16   SpO2 99%    Gen: very anxious but in no acute distress, afebrile, mildly tachycardic likely from anxiousness  Neuro: Awake and Alert  Skin: Warm    Focused Exam: dental decay to lower left teeth. Mild trismus, able to open mouth about 2.5 finger bredths. Chin moderately indurated without fluctuance, faint erythema and warmth, and exquisitely TTP. Appears to be able to swallow but she does occasionally wipe her mouth of spit. Induration doesn't appear to extend into the subfloor of the mouth, but she's tender in the submandibular region, shotty LAD.    Initiation of care has begun. The patient has been counseled on the process, plan, and necessity for staying for the completion/evaluation, and the remainder of the medical screening examination     [address] Kacey Dysert, [place], [place] 08/20/17 1612    Melene Plan, DO 08/20/17 1652

## 2017-08-20 NOTE — ED Triage Notes (Signed)
Pt states she started having swelling/redness to her chin- swelling spread into upper neck. Painful swallowing. Denies trouble breathing. Pt states she has a "bad tooth"- lower front tooth.

## 2017-08-20 NOTE — ED Notes (Signed)
ED Provider at bedside. 

## 2017-08-20 NOTE — Discharge Instructions (Signed)
Please read attached information. If you experience any new or worsening signs or symptoms please return to the emergency room for evaluation. Please follow-up with your primary care provider or specialist as discussed. Please use medication prescribed only as directed and discontinue taking if you have any concerning signs or symptoms.   °

## 2018-04-14 ENCOUNTER — Other Ambulatory Visit: Payer: Self-pay

## 2018-04-14 ENCOUNTER — Encounter (HOSPITAL_COMMUNITY): Payer: Self-pay | Admitting: Emergency Medicine

## 2018-04-14 ENCOUNTER — Emergency Department (HOSPITAL_COMMUNITY)
Admission: EM | Admit: 2018-04-14 | Discharge: 2018-04-14 | Discharge status: Against Medical Advice | Payer: Self-pay | Attending: Emergency Medicine | Admitting: Emergency Medicine

## 2018-04-14 DIAGNOSIS — Z5321 Procedure and treatment not carried out due to patient leaving prior to being seen by health care provider: Secondary | ICD-10-CM | POA: Insufficient documentation

## 2018-04-14 NOTE — ED Triage Notes (Signed)
Pt states she has lower left tooth pain. Tooth pain started yesterday. Pt has not seen dentist due to no dental insurance. Pt states she is having trouble swallowing. Per NT, pt was eating chips in the lobby.

## 2018-04-14 NOTE — ED Notes (Signed)
Pt brother states that the patient left

## 2018-11-14 ENCOUNTER — Other Ambulatory Visit: Payer: Self-pay

## 2018-11-14 ENCOUNTER — Emergency Department (HOSPITAL_COMMUNITY)
Admission: EM | Admit: 2018-11-14 | Discharge: 2018-11-15 | Disposition: A | Discharge status: Home / Self Care | Payer: Self-pay | Attending: Emergency Medicine | Admitting: Emergency Medicine

## 2018-11-14 ENCOUNTER — Encounter (HOSPITAL_COMMUNITY): Payer: Self-pay | Admitting: Emergency Medicine

## 2018-11-14 DIAGNOSIS — F17210 Nicotine dependence, cigarettes, uncomplicated: Secondary | ICD-10-CM | POA: Insufficient documentation

## 2018-11-14 DIAGNOSIS — K029 Dental caries, unspecified: Secondary | ICD-10-CM | POA: Insufficient documentation

## 2018-11-14 DIAGNOSIS — Z79899 Other long term (current) drug therapy: Secondary | ICD-10-CM | POA: Insufficient documentation

## 2018-11-14 DIAGNOSIS — K047 Periapical abscess without sinus: Secondary | ICD-10-CM | POA: Insufficient documentation

## 2018-11-14 NOTE — ED Notes (Signed)
Pt has left side facial swelling, and 10/10 throbbing pain, pt was given a heat pack  to help relieve pain.

## 2018-11-14 NOTE — ED Triage Notes (Signed)
Patient complaining of right side swelling of the mouth. Patient states she has had it for 3 days. Patient states it is painful.

## 2018-11-15 MED ORDER — AMOXICILLIN 500 MG PO CAPS: 500.0000 mg | ORAL_CAPSULE | Freq: Three times a day (TID) | ORAL | 0 refills | Status: AC

## 2018-11-15 MED ORDER — OXYCODONE-ACETAMINOPHEN 5-325 MG PO TABS
1.0000 | ORAL_TABLET | Freq: Once | ORAL | Status: AC
  Administered 2018-11-15: 1 via ORAL
  Filled 2018-11-15: qty 1

## 2018-11-15 MED ORDER — AMOXICILLIN 500 MG PO CAPS
500.0000 mg | ORAL_CAPSULE | Freq: Once | ORAL | Status: AC
  Administered 2018-11-15: 500 mg via ORAL
  Filled 2018-11-15: qty 1

## 2018-11-15 NOTE — ED Provider Notes (Signed)
[facility]-EMERGENCY DEPT Provider Note   CSN: [id number] Arrival date & time: 11/14/18  2051     History   Chief Complaint Chief Complaint  Patient presents with  . Oral Swelling    HPI Debra Jensen is a 28 y.o. female.     The history is provided by the patient. No language interpreter was used.  Dental Pain Location:  Lower Lower teeth location:  22/LL cuspid Quality:  Constant and throbbing Severity:  Moderate Onset quality:  Gradual Duration:  3 days Timing:  Constant Progression:  Waxing and waning Chronicity:  New Context: abscess and cap fell off   Relieved by:  Nothing Worsened by:  Touching and jaw movement Ineffective treatments:  NSAIDs Associated symptoms: facial pain, facial swelling and gum swelling   Associated symptoms: no difficulty swallowing, no fever, no oral bleeding and no oral lesions   Risk factors: lack of dental care     Past Medical History:  Diagnosis Date  . Anemia    with preg  . Back pain, chronic   . Chlamydia   . GERD (gastroesophageal reflux disease)   . Headache(784.0)   . Reflux   . Scoliosis   . Urinary tract infection     Patient Active Problem List   Diagnosis Date Noted  . S/P Repeat Cesarean Section and BTS 08/12/2012  . Low back pain 07/09/2012  . Desires Sterilization 07/09/2012  . Eczema 07/09/2012  . Drug use complicating pregnancy 06/17/2012  . Previous cesarean delivery, antepartum condition or complication 04/10/2012  . Supervision of high risk pregnancy in second trimester 04/07/2012  . Scoliosis 04/07/2012    Past Surgical History:  Procedure Laterality Date  . BACK SURGERY     harrington rods  . CESAREAN SECTION    . CESAREAN SECTION N/A 08/12/2012   Procedure: CESAREAN SECTION;  Surgeon: Tereso NewcomerUgonna A Anyanwu, MD;  Location: [facility];  Service: Obstetrics;  Laterality: N/A;  . TUBAL LIGATION  08/12/2012   Procedure: BILATERAL TUBAL LIGATION;  Surgeon: Tereso NewcomerUgonna A Anyanwu, MD;   Location: [facility];  Service: Obstetrics;;     OB History    Gravida  3   Para  3   Term  3   Preterm  0   AB  0   Living  3     SAB  0   TAB  0   Ectopic  0   Multiple  0   Live Births  3            Home Medications    Prior to Admission medications   Medication Sig Start Date End Date Taking? Authorizing Provider  ibuprofen (ADVIL) 200 MG tablet Take 400 mg by mouth every 6 (six) hours as needed for moderate pain.   Yes [provider]  acetaminophen (TYLENOL) 325 MG tablet Take 2 tablets (650 mg total) by mouth 3 (three) times daily with meals. Patient not taking: Reported on 05/12/2017 03/20/17   Caccavale, Sophia, PA-C  acetaminophen-codeine (TYLENOL #3) 300-30 MG tablet Take 1-2 tablets by mouth every 6 (six) hours as needed for moderate pain. Patient not taking: Reported on 03/20/2017 12/28/15   Dowless, Lelon MastSamantha Tripp, PA-C  albuterol (PROVENTIL HFA;VENTOLIN HFA) 108 (90 BASE) MCG/ACT inhaler Inhale 2 puffs into the lungs every 4 (four) hours as needed for wheezing or shortness of breath. Patient not taking: Reported on 05/12/2017 01/10/14   Ward, Layla MawKristen N, DO  amoxicillin (AMOXIL) 500 MG capsule Take 1 capsule (500 mg  total) by mouth 3 (three) times daily. 11/15/18   Antony MaduraHumes, Kanchan Gal, PA-C  amoxicillin-clavulanate (AUGMENTIN) 875-125 MG tablet Take 1 tablet by mouth every 12 (twelve) hours. Patient not taking: Reported on 11/15/2018 08/20/17   Hedges, Tinnie GensJeffrey, PA-C  amoxicillin-clavulanate (AUGMENTIN) 875-125 MG tablet Take 1 tablet by mouth every 12 (twelve) hours. Patient not taking: Reported on 11/15/2018 08/20/17   Hedges, Tinnie GensJeffrey, PA-C  cephALEXin (KEFLEX) 500 MG capsule Take 1 capsule (500 mg total) by mouth 2 (two) times daily. Patient not taking: Reported on 11/15/2018 05/12/17   Maczis, Elmer SowMichael M, PA-C  cyclobenzaprine (FLEXERIL) 10 MG tablet Take 1 tablet (10 mg total) by mouth 2 (two) times daily as needed for muscle spasms. Patient not taking: Reported  on 03/20/2017 12/28/15   Dowless, Lester KinsmanSamantha Tripp, PA-C  HYDROcodone-acetaminophen (NORCO/VICODIN) 5-325 MG tablet Take 1-2 tablets by mouth every 4 (four) hours as needed. Patient not taking: Reported on 03/20/2017 05/13/15   Santiago GladLaisure, Heather, PA-C  HYDROcodone-acetaminophen (NORCO/VICODIN) 5-325 MG tablet Take 1 tablet by mouth every 4 (four) hours as needed. Patient not taking: Reported on 03/20/2017 07/09/15   Sam, Ace GinsSerena Y, PA-C  morphine (MSIR) 30 MG tablet Take 1 tablet (30 mg total) by mouth every 6 (six) hours as needed for severe pain. Patient not taking: Reported on 05/12/2017 03/20/17   Caccavale, Sophia, PA-C  ondansetron (ZOFRAN ODT) 4 MG disintegrating tablet Take 1 tablet (4 mg total) by mouth every 8 (eight) hours as needed for nausea or vomiting. Patient not taking: Reported on 05/12/2017 03/20/17   Caccavale, Sophia, PA-C  ondansetron (ZOFRAN) 4 MG tablet Take 1 tablet (4 mg total) by mouth every 8 (eight) hours as needed for nausea or vomiting. Patient not taking: Reported on 11/15/2018 05/12/17   Jacinto HalimMaczis, Michael M, PA-C  oxyCODONE-acetaminophen (PERCOCET/ROXICET) 5-325 MG tablet Take 1 tablet by mouth every 6 (six) hours as needed for severe pain. Patient not taking: Reported on 11/15/2018 08/20/17   Hedges, Tinnie GensJeffrey, PA-C  traMADol (ULTRAM) 50 MG tablet Take 1 tablet (50 mg total) by mouth every 6 (six) hours as needed. Patient not taking: Reported on 03/20/2017 05/13/15   Santiago GladLaisure, Heather, PA-C    Family History Family History  Problem Relation Age of Onset  . Hypertension Other   . Hypertension Mother   . Cancer Mother   . Cancer Paternal Uncle   . Other Neg Hx     Social History Social History   Tobacco Use  . Smoking status: Current Every Day Smoker    Packs/day: 0.25    Years: 4.00    Pack years: 1.00    Types: Cigarettes  . Smokeless tobacco: Never Used  Substance Use Topics  . Alcohol use: Yes    Comment: occasional/social  . Drug use: No     Allergies    Motrin [ibuprofen] and Naproxen   Review of Systems Review of Systems  Constitutional: Negative for fever.  HENT: Positive for facial swelling. Negative for mouth sores.   Ten systems reviewed and are negative for acute change, except as noted in the HPI.    Physical Exam Updated Vital Signs BP 126/66 (BP Location: Left Arm)   Pulse 92   Temp 98.7 F (37.1 C) (Oral)   Resp 18   Ht 5\' 2"  (1.575 m)   Wt 62.1 kg   LMP 10/20/2018   SpO2 100%   BMI 25.06 kg/m   Physical Exam Vitals signs and nursing note reviewed.  Constitutional:      General: She is  not in acute distress.    Appearance: She is well-developed. She is not diaphoretic.     Comments: Nontoxic appearing  HENT:     Head: Normocephalic and atraumatic.     Mouth/Throat:     Mouth: Mucous membranes are moist.     Dentition: Dental caries and dental abscesses present.      Comments: Facial swelling to L lower jaw associated with fluctuance at base of L lower canine. No active drainage. Soft oral floor. Tolerating secretions. Eyes:     General: No scleral icterus.    Conjunctiva/sclera: Conjunctivae normal.  Neck:     Musculoskeletal: Normal range of motion.     Comments: No nuchal rigidity or meningismus Pulmonary:     Effort: Pulmonary effort is normal. No respiratory distress.  Musculoskeletal: Normal range of motion.  Skin:    General: Skin is warm and dry.     Coloration: Skin is not pale.     Findings: No erythema or rash.  Neurological:     Mental Status: She is alert and oriented to person, place, and time.  Psychiatric:        Mood and Affect: Mood is anxious.        Behavior: Behavior normal.      ED Treatments / Results  Labs (all labs ordered are listed, but only abnormal results are displayed) Labs Reviewed - No data to display  EKG None  Radiology No results found.  Procedures .Marland KitchenIncision and Drainage  Date/Time: 11/15/2018 1:22 AM Performed by: Antonietta Breach, PA-C Authorized  by: Antonietta Breach, PA-C   Consent:    Consent obtained:  Verbal   Consent given by:  Patient   Risks discussed:  Bleeding, incomplete drainage and pain   Alternatives discussed:  No treatment Location:    Type:  Abscess   Size:  Small   Location:  Mouth   Mouth location: L lower cuspid (canine) Anesthesia (see MAR for exact dosages):    Anesthesia method:  Topical application   Topical anesthetic:  Benzocaine gel Procedure type:    Complexity:  Simple Procedure details:    Needle aspiration: no     Incision types:  Single straight   Scalpel blade:  11   Drainage:  Purulent   Drainage amount:  Moderate   Wound treatment:  Wound left open   Packing materials:  None Post-procedure details:    Patient tolerance of procedure:  Tolerated well, no immediate complications   (including critical care time)  Medications Ordered in ED Medications  oxyCODONE-acetaminophen (PERCOCET/ROXICET) 5-325 MG per tablet 1 tablet (1 tablet Oral Given 11/15/18 0032)  amoxicillin (AMOXIL) capsule 500 mg (500 mg Oral Given 11/15/18 0040)     Initial Impression / Assessment and Plan / ED Course  I have reviewed the triage vital signs and the nursing notes.  Pertinent labs & imaging results that were available during my care of the patient were reviewed by me and considered in my medical decision making (see chart for details).        Patient with toothache x 3 days with facial swelling. Exam c/w dental abscess which was drained at bedside without complications. Exam unconcerning for Ludwig's angina or spread of infection.  Will treat with Amoxicillin and pain medicine.  Urged patient to follow-up with dentist.  Return precautions discussed and provided. Patient discharged in stable condition with no unaddressed concerns.   Final Clinical Impressions(s) / ED Diagnoses   Final diagnoses:  Dental abscess  ED Discharge Orders         Ordered    amoxicillin (AMOXIL) 500 MG capsule  3 times  daily     11/15/18 0106           Antony MaduraHumes, Adeli Frost, PA-C 11/15/18 0124    Nira Connardama, Pedro Eduardo, MD 11/16/18 2006

## 2018-11-15 NOTE — ED Notes (Signed)
Requested supplies at bedside 

## 2018-11-15 NOTE — Discharge Instructions (Signed)
Follow-up with a dentist.  Only a dentist can fix your problem.  We recommend that you take amoxicillin as prescribed to treat likely underlying infection.  Take ibuprofen as prescribed for pain control.  Return to the ED for any new or concerning symptoms. 

## 2019-06-16 ENCOUNTER — Other Ambulatory Visit: Payer: Self-pay

## 2019-06-16 ENCOUNTER — Encounter (HOSPITAL_COMMUNITY): Payer: Self-pay | Admitting: Emergency Medicine

## 2019-06-16 ENCOUNTER — Emergency Department (HOSPITAL_COMMUNITY): Payer: Self-pay

## 2019-06-16 ENCOUNTER — Emergency Department (HOSPITAL_COMMUNITY)
Admission: EM | Admit: 2019-06-16 | Discharge: 2019-06-16 | Disposition: A | Discharge status: Home / Self Care | Payer: Self-pay | Attending: Emergency Medicine | Admitting: Emergency Medicine

## 2019-06-16 DIAGNOSIS — Z79899 Other long term (current) drug therapy: Secondary | ICD-10-CM | POA: Insufficient documentation

## 2019-06-16 DIAGNOSIS — F17210 Nicotine dependence, cigarettes, uncomplicated: Secondary | ICD-10-CM | POA: Insufficient documentation

## 2019-06-16 DIAGNOSIS — R200 Anesthesia of skin: Secondary | ICD-10-CM | POA: Insufficient documentation

## 2019-06-16 LAB — CBC WITH DIFFERENTIAL/PLATELET
Abs Immature Granulocytes: 0.01 10*3/uL (ref 0.00–0.07)
Basophils Absolute: 0 10*3/uL (ref 0.0–0.1)
Basophils Relative: 0 %
Eosinophils Absolute: 0.4 10*3/uL (ref 0.0–0.5)
Eosinophils Relative: 6 %
HCT: 40.7 % (ref 36.0–46.0)
Hemoglobin: 13 g/dL (ref 12.0–15.0)
Immature Granulocytes: 0 %
Lymphocytes Relative: 56 %
Lymphs Abs: 3.9 10*3/uL (ref 0.7–4.0)
MCH: 28.6 pg (ref 26.0–34.0)
MCHC: 31.9 g/dL (ref 30.0–36.0)
MCV: 89.5 fL (ref 80.0–100.0)
Monocytes Absolute: 0.5 10*3/uL (ref 0.1–1.0)
Monocytes Relative: 7 %
Neutro Abs: 2.1 10*3/uL (ref 1.7–7.7)
Neutrophils Relative %: 31 %
Platelets: 209 10*3/uL (ref 150–400)
RBC: 4.55 MIL/uL (ref 3.87–5.11)
RDW: 14.1 % (ref 11.5–15.5)
WBC: 7 10*3/uL (ref 4.0–10.5)
nRBC: 0 % (ref 0.0–0.2)

## 2019-06-16 LAB — BASIC METABOLIC PANEL
Anion gap: 10 (ref 5–15)
BUN: 14 mg/dL (ref 6–20)
CO2: 26 mmol/L (ref 22–32)
Calcium: 8.9 mg/dL (ref 8.9–10.3)
Chloride: 102 mmol/L (ref 98–111)
Creatinine, Ser: 0.7 mg/dL (ref 0.44–1.00)
GFR calc Af Amer: >60 mL/min (ref >60)
GFR calc non Af Amer: >60 mL/min (ref >60)
Glucose, Bld: 106 mg/dL — ABNORMAL HIGH (ref 70–99)
Potassium: 3.8 mmol/L (ref 3.5–5.1)
Sodium: 138 mmol/L (ref 135–145)

## 2019-06-16 LAB — I-STAT BETA HCG BLOOD, ED (MC, WL, AP ONLY): I-stat hCG, quantitative: <5 m[IU]/mL (ref <5)

## 2019-06-16 LAB — D-DIMER, QUANTITATIVE: D-Dimer, Quant: 0.29 ug/mL-FEU (ref 0.00–0.50)

## 2019-06-16 MED ORDER — PREDNISONE 20 MG PO TABS
20.0000 mg | ORAL_TABLET | Freq: Once | ORAL | Status: AC
  Administered 2019-06-16: 06:00:00 20 mg via ORAL
  Filled 2019-06-16: qty 1

## 2019-06-16 MED ORDER — PREDNISONE 10 MG PO TABS: 20.0000 mg | ORAL_TABLET | Freq: Two times a day (BID) | ORAL | 0 refills | Status: AC

## 2019-06-16 MED ORDER — LORAZEPAM 2 MG/ML IJ SOLN
1.0000 mg | Freq: Once | INTRAMUSCULAR | Status: AC
  Administered 2019-06-16: 1 mg via INTRAVENOUS
  Filled 2019-06-16: qty 1

## 2019-06-16 NOTE — Discharge Instructions (Signed)
Begin taking prednisone as prescribed.  Follow-up with neurology if your symptoms or not improving in the next few days.  The contact information for [facility] neurology has been provided in this discharge summary for you to call and make these arrangements.  Return to the emergency department in the meantime if symptoms significantly worsen or change.

## 2019-06-16 NOTE — ED Notes (Signed)
Ativan given per MAR. Name/DOB verified with pt. Pt taken to MRI in NAD

## 2019-06-16 NOTE — ED Notes (Signed)
Pt to ED rm 29 from WR. Pt is A&Ox4, in NAD. Breathing easy, non-labored. Speaking in full sentences. Pt c/o R foot numbness/tingling since Wednesday. Pt states "I need a note to wear my slides to work because the pins and needles won't let me put my shoes on." Pt reports decreased sensation and paresthesia. Distal pulses palpable

## 2019-06-16 NOTE — ED Notes (Signed)
EDP at bedside  

## 2019-06-16 NOTE — ED Notes (Signed)
Patient verbalizes understanding of discharge instructions. PIV removed, catheter intact. Site dressed with gauze and tape. Opportunity for questioning and answers were provided. All questions answered completely. Armband removed by staff, pt discharged from ED. Ambulatory from ED with strong, steady gait

## 2019-06-16 NOTE — ED Provider Notes (Signed)
[facility] EMERGENCY DEPARTMENT Provider Note   CSN: [id number] Arrival date & time: 06/16/19  0124     History Chief Complaint  Patient presents with  . Numbness    Debra Jensen is a 29 y.o. female.  Patient is a 29 year old female with past medical history of anemia, scoliosis.  She presents today for evaluation of numbness to her right foot.  Patient states that 5 days ago she drove from [place] to here and was in the car for approximately 9 hours.  After the drive, she noticed numbness to her right foot.  She describes a "pins-and-needles" sensation to her right foot along with discomfort when she attempts to ambulate.  This numbness is now extending up the back of her calf.  She denies any swelling of the leg.  She denies any chest pain or difficulty breathing.  She denies to me she is experiencing any back pain.  The history is provided by the patient.       Past Medical History:  Diagnosis Date  . Anemia    with preg  . Back pain, chronic   . Chlamydia   . GERD (gastroesophageal reflux disease)   . Headache(784.0)   . Reflux   . Scoliosis   . Urinary tract infection     Patient Active Problem List   Diagnosis Date Noted  . S/P Repeat Cesarean Section and BTS 08/12/2012  . Low back pain 07/09/2012  . Desires Sterilization 07/09/2012  . Eczema 07/09/2012  . Drug use complicating pregnancy 06/17/2012  . Previous cesarean delivery, antepartum condition or complication 04/10/2012  . Supervision of high risk pregnancy in second trimester 04/07/2012  . Scoliosis 04/07/2012    Past Surgical History:  Procedure Laterality Date  . BACK SURGERY     harrington rods  . CESAREAN SECTION    . CESAREAN SECTION N/A 08/12/2012   Procedure: CESAREAN SECTION;  Surgeon: Tereso Newcomer, MD;  Location: [facility];  Service: Obstetrics;  Laterality: N/A;  . TUBAL LIGATION  08/12/2012   Procedure: BILATERAL TUBAL LIGATION;  Surgeon: Tereso Newcomer, MD;   Location: [facility];  Service: Obstetrics;;     OB History    Gravida  3   Para  3   Term  3   Preterm  0   AB  0   Living  3     SAB  0   TAB  0   Ectopic  0   Multiple  0   Live Births  3           Family History  Problem Relation Age of Onset  . Hypertension Other   . Hypertension Mother   . Cancer Mother   . Cancer Paternal Uncle   . Other Neg Hx     Social History   Tobacco Use  . Smoking status: Current Every Day Smoker    Packs/day: 0.25    Years: 4.00    Pack years: 1.00    Types: Cigarettes  . Smokeless tobacco: Never Used  Substance Use Topics  . Alcohol use: Yes    Comment: occasional/social  . Drug use: No    Home Medications Prior to Admission medications   Medication Sig Start Date End Date Taking? Authorizing Provider  acetaminophen (TYLENOL) 325 MG tablet Take 2 tablets (650 mg total) by mouth 3 (three) times daily with meals. Patient not taking: Reported on 05/12/2017 03/20/17   Caccavale, Sophia, PA-C  acetaminophen-codeine (TYLENOL #3) 300-30  MG tablet Take 1-2 tablets by mouth every 6 (six) hours as needed for moderate pain. Patient not taking: Reported on 03/20/2017 12/28/15   Dowless, Aldona Bar Tripp, PA-C  albuterol (PROVENTIL HFA;VENTOLIN HFA) 108 (90 BASE) MCG/ACT inhaler Inhale 2 puffs into the lungs every 4 (four) hours as needed for wheezing or shortness of breath. Patient not taking: Reported on 05/12/2017 01/10/14   Ward, Delice Bison, DO  amoxicillin (AMOXIL) 500 MG capsule Take 1 capsule (500 mg total) by mouth 3 (three) times daily. 11/15/18   Antonietta Breach, PA-C  amoxicillin-clavulanate (AUGMENTIN) 875-125 MG tablet Take 1 tablet by mouth every 12 (twelve) hours. Patient not taking: Reported on 11/15/2018 08/20/17   Hedges, Dellis Filbert, PA-C  amoxicillin-clavulanate (AUGMENTIN) 875-125 MG tablet Take 1 tablet by mouth every 12 (twelve) hours. Patient not taking: Reported on 11/15/2018 08/20/17   Hedges, Dellis Filbert, PA-C  cephALEXin  (KEFLEX) 500 MG capsule Take 1 capsule (500 mg total) by mouth 2 (two) times daily. Patient not taking: Reported on 11/15/2018 05/12/17   Maczis, Barth Kirks, PA-C  cyclobenzaprine (FLEXERIL) 10 MG tablet Take 1 tablet (10 mg total) by mouth 2 (two) times daily as needed for muscle spasms. Patient not taking: Reported on 03/20/2017 12/28/15   Dowless, Dondra Spry, PA-C  HYDROcodone-acetaminophen (NORCO/VICODIN) 5-325 MG tablet Take 1-2 tablets by mouth every 4 (four) hours as needed. Patient not taking: Reported on 03/20/2017 05/13/15   Hyman Bible, PA-C  HYDROcodone-acetaminophen (NORCO/VICODIN) 5-325 MG tablet Take 1 tablet by mouth every 4 (four) hours as needed. Patient not taking: Reported on 03/20/2017 07/09/15   Sam, Olivia Canter, PA-C  ibuprofen (ADVIL) 200 MG tablet Take 400 mg by mouth every 6 (six) hours as needed for moderate pain.    [provider]  morphine (MSIR) 30 MG tablet Take 1 tablet (30 mg total) by mouth every 6 (six) hours as needed for severe pain. Patient not taking: Reported on 05/12/2017 03/20/17   Caccavale, Sophia, PA-C  ondansetron (ZOFRAN ODT) 4 MG disintegrating tablet Take 1 tablet (4 mg total) by mouth every 8 (eight) hours as needed for nausea or vomiting. Patient not taking: Reported on 05/12/2017 03/20/17   Caccavale, Sophia, PA-C  ondansetron (ZOFRAN) 4 MG tablet Take 1 tablet (4 mg total) by mouth every 8 (eight) hours as needed for nausea or vomiting. Patient not taking: Reported on 11/15/2018 05/12/17   Jillyn Ledger, PA-C  oxyCODONE-acetaminophen (PERCOCET/ROXICET) 5-325 MG tablet Take 1 tablet by mouth every 6 (six) hours as needed for severe pain. Patient not taking: Reported on 11/15/2018 08/20/17   Hedges, Dellis Filbert, PA-C  traMADol (ULTRAM) 50 MG tablet Take 1 tablet (50 mg total) by mouth every 6 (six) hours as needed. Patient not taking: Reported on 03/20/2017 05/13/15   Hyman Bible, PA-C    Allergies    Motrin [ibuprofen] and  Naproxen  Review of Systems   Review of Systems  All other systems reviewed and are negative.   Physical Exam Updated Vital Signs BP 132/89 (BP Location: Left Arm)   Pulse 83   Temp 98 F (36.7 C) (Oral)   Resp 16   LMP 06/14/2019   SpO2 100%   Physical Exam Vitals and nursing note reviewed.  Constitutional:      General: She is not in acute distress.    Appearance: She is well-developed. She is not diaphoretic.  HENT:     Head: Normocephalic and atraumatic.  Cardiovascular:     Rate and Rhythm: Normal rate and regular rhythm.  Heart sounds: No murmur. No friction rub. No gallop.   Pulmonary:     Effort: Pulmonary effort is normal. No respiratory distress.     Breath sounds: Normal breath sounds. No wheezing.  Abdominal:     General: Bowel sounds are normal. There is no distension.     Palpations: Abdomen is soft.     Tenderness: There is no abdominal tenderness.  Musculoskeletal:        General: Normal range of motion.     Cervical back: Normal range of motion and neck supple.     Comments: The lower extremities are nonedematous and symmetrical.  DP pulses are easily palpable bilaterally.  DTRs are trace in the patellar and Achilles tendons of both lower extremities.  Patient reports being unable to plantar flex or extend her foot and also reports she is unable to move her toes.  Skin:    General: Skin is warm and dry.  Neurological:     Mental Status: She is alert and oriented to person, place, and time.     ED Results / Procedures / Treatments   Labs (all labs ordered are listed, but only abnormal results are displayed) Labs Reviewed  BASIC METABOLIC PANEL  CBC WITH DIFFERENTIAL/PLATELET  D-DIMER, QUANTITATIVE (NOT AT [facility])  I-STAT BETA HCG BLOOD, ED ([facility], [facility], [facility] ONLY)    EKG None  Radiology No results found.  Procedures Procedures (including critical care time)  Medications Ordered in ED Medications - No data to display  ED Course  I have  reviewed the triage vital signs and the nursing notes.  Pertinent labs & imaging results that were available during my care of the patient were reviewed by me and considered in my medical decision making (see chart for details).    MDM Rules/Calculators/A&P  Patient presents with complaints of a pins-and-needles sensation to her right lower leg and foot.  This began while making a car trip from [place] to [place].  Patient describes an inability to move her foot, however patellar and Achilles reflexes are present.  Patient's MRI of her lumbar spine is unremarkable.  D-dimer is negative.  I am uncertain as to the etiology of the numbness of her foot, however I suspect some sort of compressive palsy from sitting in the car for a prolonged period of time.  Clinically this does not appear to be a DVT and with negative D-dimer, I feel as though this effectively rules out this possibility.  Patient to be treated with prednisone.  If she is not improving in the next few days, she is to follow-up with neurology to discuss further testing.  Final Clinical Impression(s) / ED Diagnoses Final diagnoses:  None    Rx / DC Orders ED Discharge Orders    None       Geoffery Lyons, MD 06/16/19 [phone]

## 2019-06-16 NOTE — ED Notes (Signed)
Pt back from MRI without incidence 

## 2019-06-16 NOTE — ED Notes (Addendum)
Pt extremely anxious in room. Pt states "I told him I don't like medical needles. That's how y'all f people up." Pt educated on importance of blood work and IV. Pt remains reluctant to have IV placed. Eventually, pt allowing this RN to place IV. PIV initiated, 18G to RAC. IV flushes with 10cc NS without s/s of infiltration. Positive blood return. Secured with tape and tegaderm. Pt remains anxious about having IV in her arm. Pt reassured and educated

## 2019-06-16 NOTE — ED Notes (Signed)
Dr. Judd Lien notified that pt states she is claustrophobic and is requesting medication for anxiety

## 2019-06-16 NOTE — ED Triage Notes (Signed)
Pt c/o right foot numbness and tingling since last Wednesday. States she drove home from [place]. Reports it feels like her foot is asleep. No swelling, denies injury.

## 2019-11-28 ENCOUNTER — Emergency Department (HOSPITAL_COMMUNITY)
Admission: EM | Admit: 2019-11-28 | Discharge: 2019-11-28 | Disposition: A | Discharge status: Home / Self Care | Payer: Self-pay | Attending: Emergency Medicine | Admitting: Emergency Medicine

## 2019-11-28 ENCOUNTER — Encounter (HOSPITAL_COMMUNITY): Payer: Self-pay

## 2019-11-28 DIAGNOSIS — R109 Unspecified abdominal pain: Secondary | ICD-10-CM | POA: Insufficient documentation

## 2019-11-28 DIAGNOSIS — N939 Abnormal uterine and vaginal bleeding, unspecified: Secondary | ICD-10-CM | POA: Insufficient documentation

## 2019-11-28 LAB — BASIC METABOLIC PANEL
Anion gap: 8 (ref 5–15)
BUN: 11 mg/dL (ref 6–20)
CO2: 27 mmol/L (ref 22–32)
Calcium: 9.5 mg/dL (ref 8.9–10.3)
Chloride: 106 mmol/L (ref 98–111)
Creatinine, Ser: 0.81 mg/dL (ref 0.44–1.00)
GFR calc Af Amer: >60 mL/min (ref >60)
GFR calc non Af Amer: >60 mL/min (ref >60)
Glucose, Bld: 102 mg/dL — ABNORMAL HIGH (ref 70–99)
Potassium: 4.5 mmol/L (ref 3.5–5.1)
Sodium: 141 mmol/L (ref 135–145)

## 2019-11-28 LAB — HCG, QUANTITATIVE, PREGNANCY: hCG, Beta Chain, Quant, S: <1 m[IU]/mL (ref <5)

## 2019-11-28 LAB — POC URINE PREG, ED: Preg Test, Ur: NEGATIVE

## 2019-11-28 NOTE — ED Provider Notes (Signed)
Patient seen in triage for screening.  She reports that she has a history of tubal ligation.  She has not had a period since June so she checked a pregnancy test on Monday which was positive.  On Thursday 3 days ago she developed vaginal bleeding, passing large clots, and severe lower abdominal cramping.  She has had some nausea and vomiting intermittently and today when she checked her temperature reports she had a low-grade fever around 99.6 F for which she took Motrin.  She does not currently have an OB/GYN. Physical Exam  BP (!) 170/105 (BP Location: Left Arm)   Pulse 76   Temp 98 F (36.7 C) (Oral)   Resp 18   SpO2 100%   Physical Exam Vitals and nursing note reviewed.  Constitutional:      General: She is not in acute distress.    Appearance: She is well-developed.  HENT:     Head: Normocephalic and atraumatic.  Eyes:     General:        Right eye: No discharge.        Left eye: No discharge.     Conjunctiva/sclera: Conjunctivae normal.  Neck:     Vascular: No JVD.     Trachea: No tracheal deviation.  Cardiovascular:     Rate and Rhythm: Normal rate.  Pulmonary:     Effort: Pulmonary effort is normal.  Abdominal:     General: Abdomen is protuberant. There is no distension.     Palpations: Abdomen is soft.     Tenderness: There is no abdominal tenderness. There is no right CVA tenderness, left CVA tenderness, guarding or rebound.  Skin:    Findings: No erythema.  Neurological:     Mental Status: She is alert.  Psychiatric:        Behavior: Behavior normal.     ED Course/Procedures     Procedures  MDM  Abdomen soft and nontender.  Patient hemodynamically stable.  Spoke with Raelyn Mora at the [facility] who advises that if her POC urine pregnant is negative then she should undergo a quantitative pregnancy test.  Urine preg negative, will obtain quant.        Bennye Alm 11/28/19 2037    Terald Sleeper, MD 11/29/19 [phone]

## 2019-11-28 NOTE — ED Triage Notes (Signed)
Pt arrives to ED w/ c/o abdominal pain and heavy vaginal bleeding that started yesterday. Pt reports positive pregnancy test Monday. Pt has hx of tubal ligation.

## 2020-10-24 IMAGING — MR MR LUMBAR SPINE W/O CM
4 of 5 series · 27 of 48 positions shown · non-contrast
Comparison: 05/12/2017 CT

CLINICAL DATA: Lumbar radiculopathy without red flag symptoms.
Right foot numbness and tingling since [REDACTED]

EXAM:
MRI LUMBAR SPINE WITHOUT CONTRAST
TECHNIQUE: Multiplanar, multisequence MR imaging of the lumbar spine was
performed. No intravenous contrast was administered.

[Series 9: T2 · sagittal · 4.0mm · 0.73mm/px · 6 of 15 slices shown (1 of 2)]
[im 1/15]
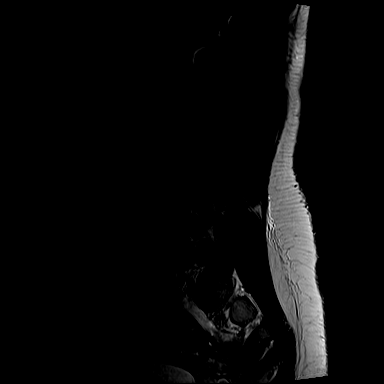
[im 3/15]
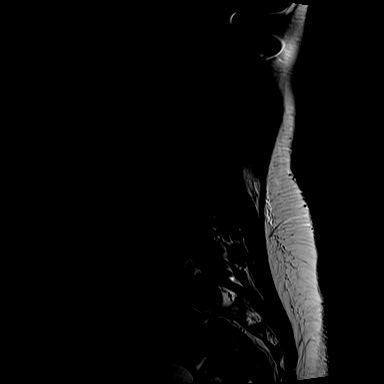
[im 6/15]
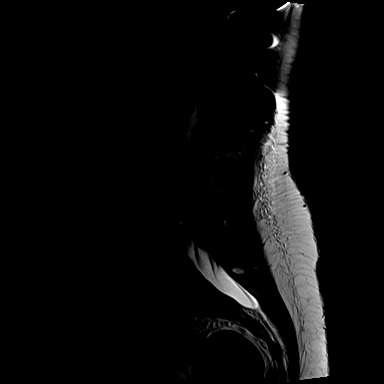
[im 9/15]
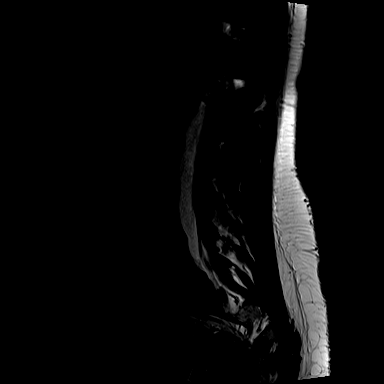
[im 12/15]
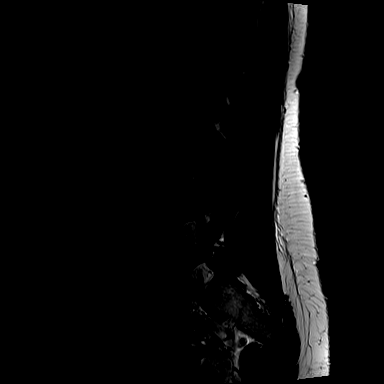
[im 15/15]
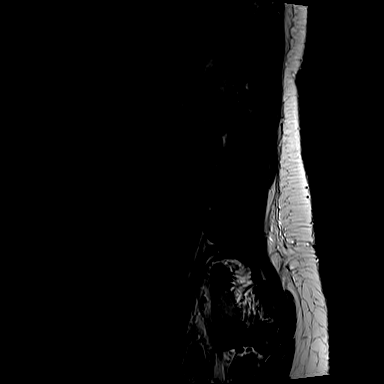

[Series 11: T1 · sagittal · 4.0mm · 0.88mm/px · 7 of 15 slices shown (1 of 2)]
[im 1/15]
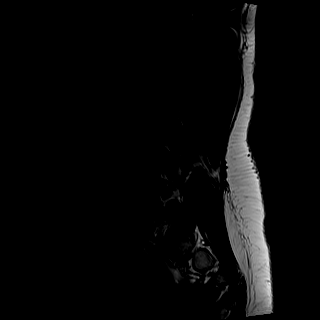
[im 3/15]
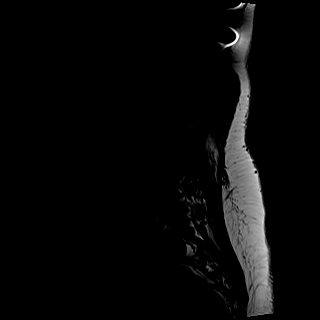
[im 5/15]
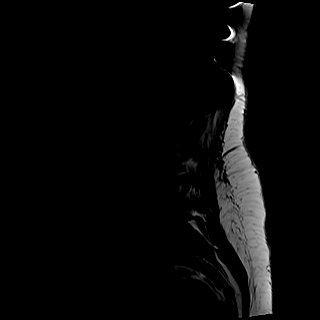
[im 8/15]
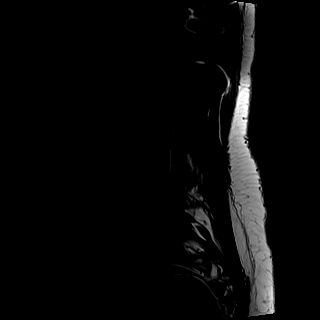
[im 10/15]
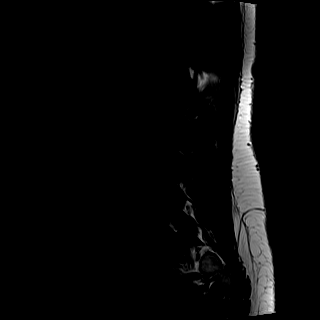
[im 12/15]
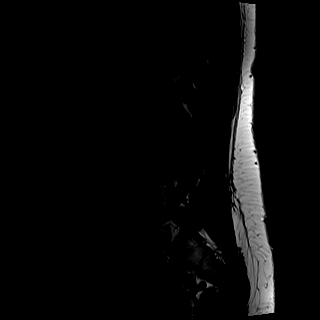
[im 15/15]
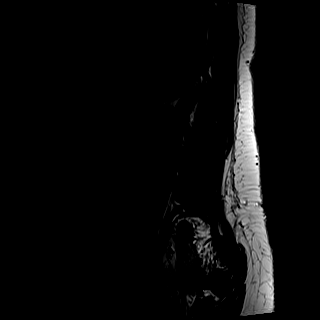

[Series 12: T2 · axial · 4.0mm · 0.57mm/px · z∈[-15,+154]mm · 8 of 29 slices shown (2 of 2)]
[im 1/29]
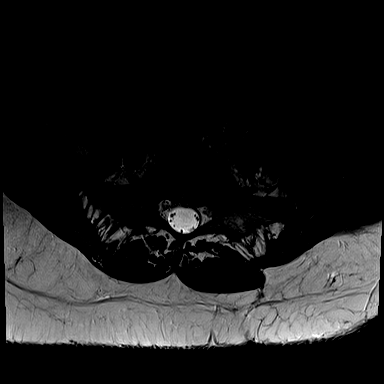
[im 5/29]
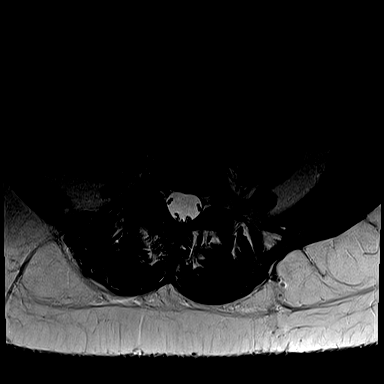
[im 9/29]
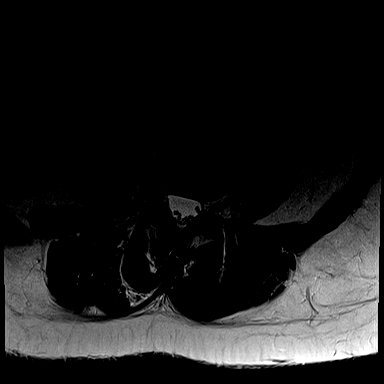
[im 13/29]
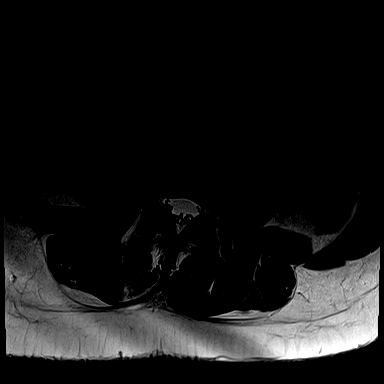
[im 16/29]
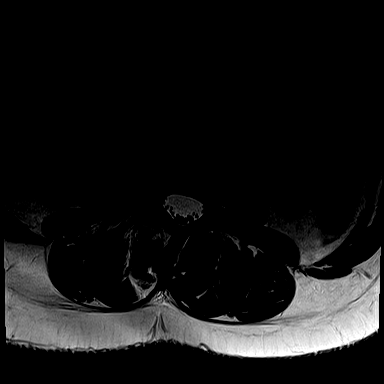
[im 20/29]
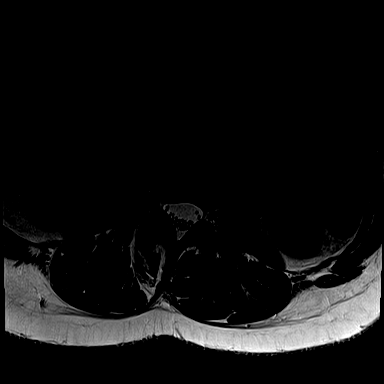
[im 24/29]
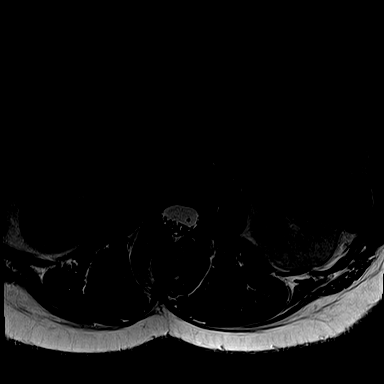
[im 29/29]
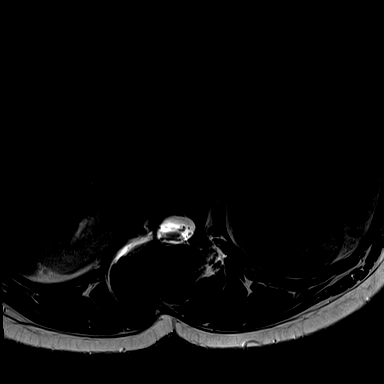

[Series 13: T1 · axial · 4.0mm · 0.34mm/px · z∈[-15,+129]mm · 6 of 29 slices shown (2 of 2)]
[im 1/29]
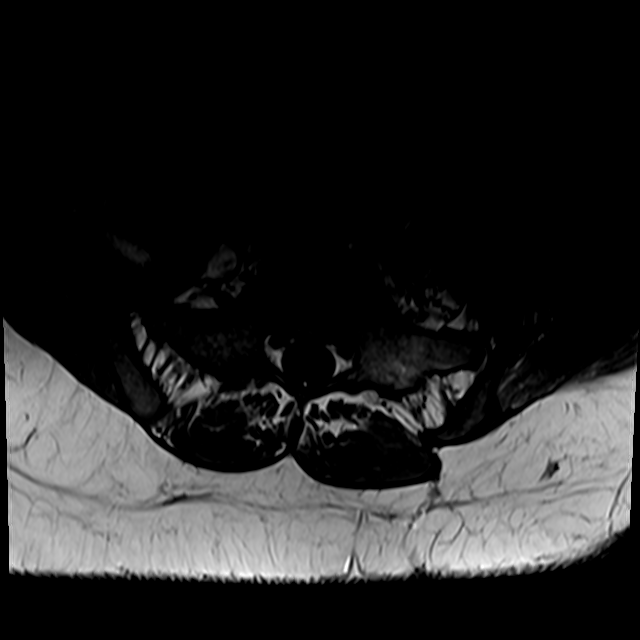
[im 5/29]
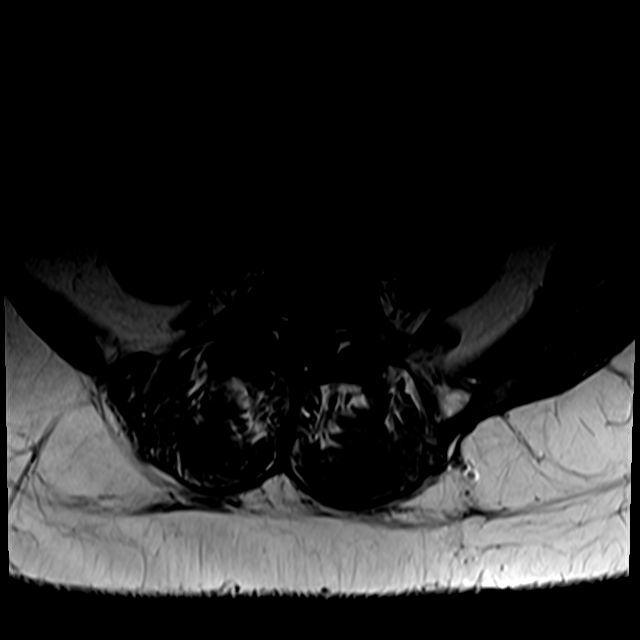
[im 9/29]
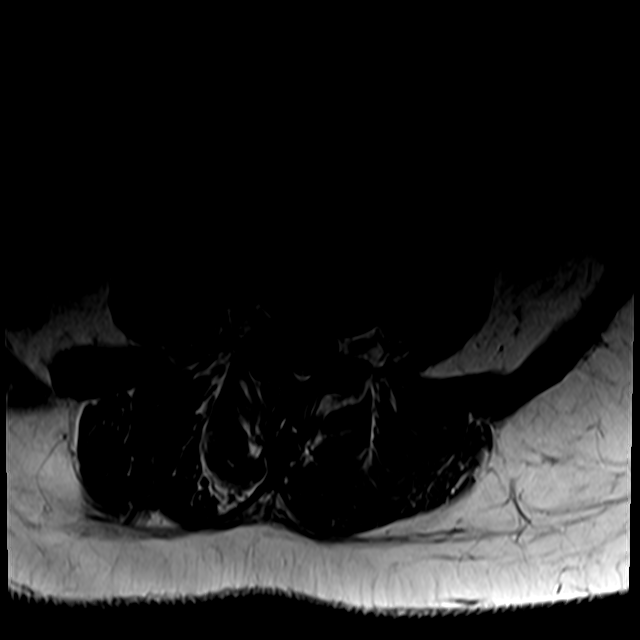
[im 13/29]
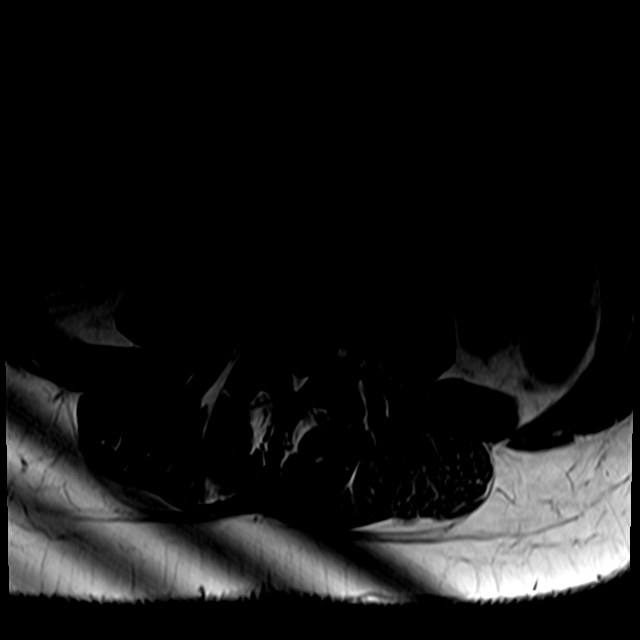
[im 16/29]
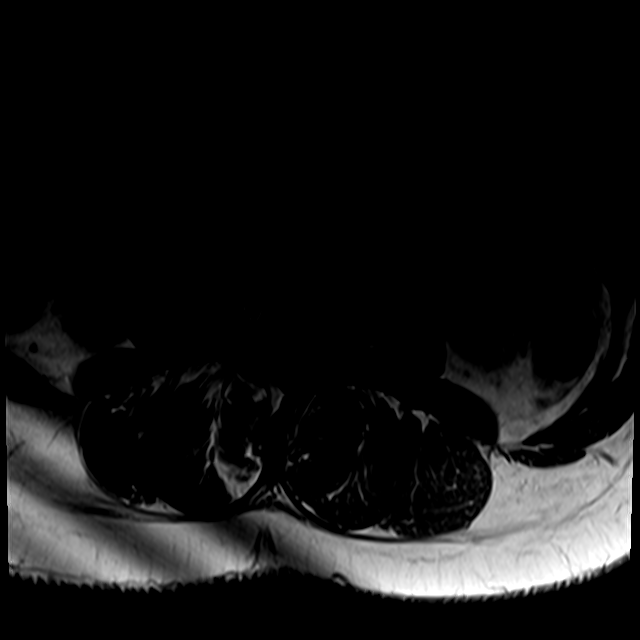
[im 24/29]
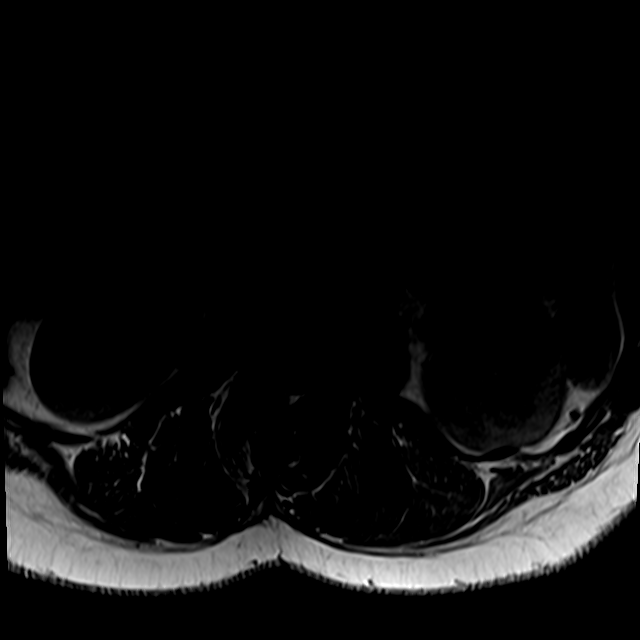

[27 of 48 positions shown; findings below may reference images not displayed]

FINDINGS: Segmentation: Standard lumbar numbering based on the lowest ribs on
comparison CT.

Alignment: There is scoliosis with prior thoracic fusion. There is
compensatory levoscoliosis of the lumbar spine.

Vertebrae:  No fracture, evidence of discitis, or bone lesion.

Conus medullaris and cauda equina: Conus extends to the L1-2 level.
Conus and cauda equina appear normal. Minimal fat deposition within
the filum terminalis.

Paraspinal and other soft tissues: Negative

Disc levels:

Multilevel degenerative facet spurring asymmetric to the right,
along the scoliotic concavity. Left-sided spurring is also seen at
L5-S1. Disc height and hydration is preserved and there is no
visible herniation or nerve root impingement.
IMPRESSION: 1. No acute finding or impingement to explain right leg symptoms.
2. Compensatory levoscoliosis of the lumbar spine with premature
facet degeneration.
